# Patient Record
Sex: Female | Born: 1985 | Race: Black or African American | Hispanic: No | Marital: Single | Smoking: Current every day smoker
Health system: Southern US, Community
[De-identification: ages and names within clinical notes are randomized; demographics above are authoritative.]

## PROBLEM LIST (undated history)

## (undated) DIAGNOSIS — Z789 Other specified health status: Secondary | ICD-10-CM

## (undated) DIAGNOSIS — R87629 Unspecified abnormal cytological findings in specimens from vagina: Secondary | ICD-10-CM

## (undated) DIAGNOSIS — F419 Anxiety disorder, unspecified: Secondary | ICD-10-CM

## (undated) HISTORY — PX: NO PAST SURGERIES: SHX2092

---
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
## 2003-01-04 ENCOUNTER — Other Ambulatory Visit: Admission: RE | Admit: 2003-01-04 | Discharge: 2003-01-04 | Payer: Self-pay | Admitting: Obstetrics and Gynecology

## 2003-03-26 ENCOUNTER — Encounter: Admission: RE | Admit: 2003-03-26 | Discharge: 2003-03-26 | Payer: Self-pay | Admitting: Family Medicine

## 2003-03-26 ENCOUNTER — Encounter: Payer: Self-pay | Admitting: Family Medicine

## 2003-04-10 ENCOUNTER — Other Ambulatory Visit: Admission: RE | Admit: 2003-04-10 | Discharge: 2003-04-10 | Payer: Self-pay | Admitting: Obstetrics and Gynecology

## 2003-12-03 ENCOUNTER — Other Ambulatory Visit: Admission: RE | Admit: 2003-12-03 | Discharge: 2003-12-03 | Payer: Self-pay | Admitting: Obstetrics and Gynecology

## 2005-01-15 ENCOUNTER — Other Ambulatory Visit: Admission: RE | Admit: 2005-01-15 | Discharge: 2005-01-15 | Payer: Self-pay | Admitting: Obstetrics and Gynecology

## 2006-04-13 ENCOUNTER — Other Ambulatory Visit: Admission: RE | Admit: 2006-04-13 | Discharge: 2006-04-13 | Payer: Self-pay | Admitting: Family Medicine

## 2006-04-26 ENCOUNTER — Other Ambulatory Visit: Admission: RE | Admit: 2006-04-26 | Discharge: 2006-04-26 | Payer: Self-pay | Admitting: Family Medicine

## 2007-08-24 ENCOUNTER — Emergency Department (HOSPITAL_COMMUNITY): Admission: EM | Admit: 2007-08-24 | Discharge: 2007-08-24 | Payer: Self-pay | Admitting: Emergency Medicine

## 2008-01-08 ENCOUNTER — Emergency Department (HOSPITAL_COMMUNITY): Admission: EM | Admit: 2008-01-08 | Discharge: 2008-01-08 | Payer: Self-pay | Admitting: Family Medicine

## 2008-06-07 ENCOUNTER — Inpatient Hospital Stay (HOSPITAL_COMMUNITY): Admission: AD | Admit: 2008-06-07 | Discharge: 2008-06-07 | Payer: Self-pay | Admitting: Obstetrics & Gynecology

## 2008-06-12 ENCOUNTER — Ambulatory Visit: Payer: Self-pay | Admitting: Gynecology

## 2008-06-12 ENCOUNTER — Other Ambulatory Visit: Admission: RE | Admit: 2008-06-12 | Discharge: 2008-06-12 | Payer: Self-pay | Admitting: Gynecology

## 2008-06-12 ENCOUNTER — Encounter: Payer: Self-pay | Admitting: Gynecology

## 2009-01-10 ENCOUNTER — Emergency Department (HOSPITAL_COMMUNITY): Admission: EM | Admit: 2009-01-10 | Discharge: 2009-01-10 | Payer: Self-pay | Admitting: Emergency Medicine

## 2009-07-02 ENCOUNTER — Emergency Department (HOSPITAL_BASED_OUTPATIENT_CLINIC_OR_DEPARTMENT_OTHER): Admission: EM | Admit: 2009-07-02 | Discharge: 2009-07-02 | Payer: Self-pay | Admitting: Emergency Medicine

## 2009-07-14 ENCOUNTER — Emergency Department (HOSPITAL_BASED_OUTPATIENT_CLINIC_OR_DEPARTMENT_OTHER): Admission: EM | Admit: 2009-07-14 | Discharge: 2009-07-14 | Payer: Self-pay | Admitting: Emergency Medicine

## 2010-09-14 LAB — URINALYSIS, ROUTINE W REFLEX MICROSCOPIC
Specific Gravity, Urine: 1.013 (ref 1.005–1.030)
pH: 8 (ref 5.0–8.0)

## 2010-09-14 LAB — WET PREP, GENITAL: WBC, Wet Prep HPF POC: NONE SEEN

## 2010-09-14 LAB — GC/CHLAMYDIA PROBE AMP, GENITAL
Chlamydia, DNA Probe: NEGATIVE
GC Probe Amp, Genital: NEGATIVE

## 2010-09-14 LAB — PREGNANCY, URINE: Preg Test, Ur: NEGATIVE

## 2010-09-15 LAB — URINALYSIS, ROUTINE W REFLEX MICROSCOPIC
Bilirubin Urine: NEGATIVE
Leukocytes, UA: NEGATIVE
Nitrite: NEGATIVE
Specific Gravity, Urine: 1.026 (ref 1.005–1.030)
pH: 6.5 (ref 5.0–8.0)

## 2010-09-15 LAB — COMPREHENSIVE METABOLIC PANEL
ALT: 18 U/L (ref 0–35)
AST: 25 U/L (ref 0–37)
Albumin: 4.9 g/dL (ref 3.5–5.2)
Calcium: 9.5 mg/dL (ref 8.4–10.5)
GFR calc Af Amer: >60 mL/min (ref >60)
Glucose, Bld: 95 mg/dL (ref 70–99)
Sodium: 141 mEq/L (ref 135–145)
Total Protein: 8.7 g/dL — ABNORMAL HIGH (ref 6.0–8.3)

## 2010-09-15 LAB — DIFFERENTIAL
Eosinophils Absolute: 0 10*3/uL (ref 0.0–0.7)
Lymphs Abs: 2 10*3/uL (ref 0.7–4.0)
Monocytes Absolute: 0.4 10*3/uL (ref 0.1–1.0)
Monocytes Relative: 10 % (ref 3–12)
Neutrophils Relative %: 44 % (ref 43–77)

## 2010-09-15 LAB — GC/CHLAMYDIA PROBE AMP, GENITAL: Chlamydia, DNA Probe: NEGATIVE

## 2010-09-15 LAB — URINE MICROSCOPIC-ADD ON

## 2010-09-15 LAB — WET PREP, GENITAL

## 2010-09-15 LAB — CBC
MCHC: 34 g/dL (ref 30.0–36.0)
Platelets: 243 10*3/uL (ref 150–400)
RDW: 12 % (ref 11.5–15.5)

## 2010-09-15 LAB — PREGNANCY, URINE: Preg Test, Ur: NEGATIVE

## 2010-10-05 LAB — WET PREP, GENITAL: Yeast Wet Prep HPF POC: NONE SEEN

## 2010-10-05 LAB — GC/CHLAMYDIA PROBE AMP, GENITAL: Chlamydia, DNA Probe: POSITIVE — AB

## 2011-02-06 ENCOUNTER — Encounter (HOSPITAL_COMMUNITY): Payer: Self-pay | Admitting: *Deleted

## 2011-02-06 ENCOUNTER — Inpatient Hospital Stay (HOSPITAL_COMMUNITY)
Admission: AD | Admit: 2011-02-06 | Discharge: 2011-02-06 | Disposition: A | Discharge status: Home / Self Care | Payer: Self-pay | Source: Ambulatory Visit | Attending: Family Medicine | Admitting: Family Medicine

## 2011-02-06 DIAGNOSIS — N926 Irregular menstruation, unspecified: Secondary | ICD-10-CM | POA: Insufficient documentation

## 2011-02-06 HISTORY — DX: Other specified health status: Z78.9

## 2011-02-06 LAB — URINALYSIS, ROUTINE W REFLEX MICROSCOPIC
Bilirubin Urine: NEGATIVE
Glucose, UA: NEGATIVE mg/dL
Ketones, ur: 15 mg/dL — AB
pH: 6 (ref 5.0–8.0)

## 2011-02-06 LAB — WET PREP, GENITAL: Yeast Wet Prep HPF POC: NONE SEEN

## 2011-02-06 LAB — CBC
HCT: 41.2 % (ref 36.0–46.0)
MCV: 87.5 fL (ref 78.0–100.0)
Platelets: 251 10*3/uL (ref 150–400)
RBC: 4.71 MIL/uL (ref 3.87–5.11)
WBC: 4.3 10*3/uL (ref 4.0–10.5)

## 2011-02-06 NOTE — ED Notes (Signed)
Pt now states had postive blood pregnancy test July 18th at [facility] in [place]

## 2011-02-06 NOTE — Progress Notes (Signed)
Pt states her LMP was 4-10. Has had periods of spotting on and off with the last one on 7-22. Pt state she has been seen in [facility] in [place] and had a POS serum pregnancy test at [facility] in June. Pt is not having any problems at this time but wants to make sure everything. Has had no ulttrasound for confirmation of  What she believes is a  [redacted] week gestation.

## 2011-02-06 NOTE — ED Notes (Signed)
T. Burleson NP in to see pt 

## 2011-02-06 NOTE — Progress Notes (Signed)
Lilyan Punt NP in to see pt. Spec exam done and wet prep and GC/Chlam obtained. Pt tol well.

## 2011-02-06 NOTE — ED Notes (Signed)
LMP 10/07/10. Had spotting from 5/18 to 5/20 and seen at [facility] ED in [place].  Spotted 6-18 thru 6-20 and had heavier bleeding 7/24-7/26. Has had nausea off and on and tender breasts. Trying to get pregnant. Used Depo and Otho Tricycline 2 yrs ago but nothing since. Usually has regular periods.

## 2011-02-06 NOTE — Progress Notes (Signed)
Lilyan Punt NP gave pt d/c instructions. To f/u with primary care physician as needed

## 2011-02-06 NOTE — ED Provider Notes (Addendum)
History     Chief Complaint  Patient presents with  . Possible Pregnancy   HPI Shelly York 25 y.o. Is in [place] on business.  Had positive pregnancy test in [place] in mid July.  Has had nausea and vomiting.  LMP was in April.  Client is concerned as she has not had an ultrasound and "does not know what is going on with her body."  OB History    Grav Para Term Preterm Abortions TAB SAB Ect Mult Living   1 0 0 0 1 0 0 0 0 0       Past Medical History  Diagnosis Date  . No pertinent past medical history     Past Surgical History  Procedure Date  . No past surgeries     Family History  Problem Relation Age of Onset  . Hypertension Maternal Grandmother   . Diabetes Paternal Grandmother     History  Substance Use Topics  . Smoking status: Former Games developer  . Smokeless tobacco: Not on file  . Alcohol Use: No     States has stopped for a month now    Allergies: No Known Allergies  Prescriptions prior to admission  Medication Sig Dispense Refill  . Biotin 1000 MCG tablet Take 1,000 mcg by mouth daily.        . calcium gluconate 650 MG tablet Take 650 mg by mouth daily.        . hydrocortisone 0.5 % cream Apply 1 application topically 2 (two) times a week.        . Multiple Vitamins-Minerals (MULTIVITAMIN WITH MINERALS) tablet Take 1 tablet by mouth daily.          ROS Physical Exam   Blood pressure 131/88, pulse 96, temperature 98.8 F (37.1 C), temperature source Oral, resp. rate 18, height 5' 7.5" (1.715 m), weight 109 lb 3.2 oz (49.533 kg), last menstrual period 10/07/2010, SpO2 99.00%.  Physical Exam  Nursing note and vitals reviewed. Constitutional: She is oriented to person, place, and time. She appears well-developed and well-nourished.  HENT:  Head: Normocephalic.  Eyes: EOM are normal.  Neck: Neck supple.  GI: Soft. There is no tenderness. There is no rebound and no guarding.  Genitourinary:       Speculum exam: Vagina - Small amount of creamy  discharge, no odor Cervix - No contact bleeding Bimanual exam: Cervix closed Uterus non tender, normal size Adnexa non tender, no masses bilaterally GC/Chlam, wet prep done Chaperone present for exam.    Musculoskeletal: Normal range of motion.  Neurological: She is alert and oriented to person, place, and time.  Skin: Skin is warm and dry.  Psychiatric: She has a normal mood and affect.    MAU Course  Procedures Results for orders placed during the hospital encounter of 02/06/11 (from the past 24 hour(s))  URINALYSIS, ROUTINE W REFLEX MICROSCOPIC     Status: Abnormal   Collection Time   02/06/11  7:05 PM      Component Value Range   Color, Urine YELLOW  YELLOW    Appearance CLEAR  CLEAR    Specific Gravity, Urine >1.030 (*) 1.005 - 1.030    pH 6.0  5.0 - 8.0    Glucose, UA NEGATIVE  NEGATIVE (mg/dL)   Hgb urine dipstick NEGATIVE  NEGATIVE    Bilirubin Urine NEGATIVE  NEGATIVE    Ketones, ur 15 (*) NEGATIVE (mg/dL)   Protein, ur NEGATIVE  NEGATIVE (mg/dL)   Urobilinogen, UA 0.2  0.0 -  1.0 (mg/dL)   Nitrite NEGATIVE  NEGATIVE    Leukocytes, UA NEGATIVE  NEGATIVE   POCT PREGNANCY, URINE     Status: Normal   Collection Time   02/06/11  7:12 PM      Component Value Range   Preg Test, Ur NEGATIVE    ABO/RH     Status: Normal   Collection Time   02/06/11  8:35 PM      Component Value Range   ABO/RH(D) O POS    HCG, QUANTITATIVE, PREGNANCY     Status: Normal   Collection Time   02/06/11  8:36 PM      Component Value Range   hCG, Beta Chain, Quant, S 1  <5 (mIU/mL)  CBC     Status: Normal   Collection Time   02/06/11  8:36 PM      Component Value Range   WBC 4.3  4.0 - 10.5 (K/uL)   RBC 4.71  3.87 - 5.11 (MIL/uL)   Hemoglobin 14.6  12.0 - 15.0 (g/dL)   HCT 16.1  09.6 - 04.5 (%)   MCV 87.5  78.0 - 100.0 (fL)   MCH 31.0  26.0 - 34.0 (pg)   MCHC 35.4  30.0 - 36.0 (g/dL)   RDW 40.9  81.1 - 91.4 (%)   Platelets 251  150 - 400 (K/uL)  WET PREP, GENITAL     Status: Abnormal    Collection Time   02/06/11  9:14 PM      Component Value Range   Yeast, Wet Prep NONE SEEN  NONE SEEN    Trich, Wet Prep NONE SEEN  NONE SEEN    Clue Cells, Wet Prep MANY (*) NONE SEEN    WBC, Wet Prep HPF POC FEW (*) NONE SEEN    MDM Denies any problems with vaginal discharge.  No treatment indicated tonight.  Assessment and Plan  Not pregnant Irregular menses.  Plan: Follow up with your doctor for evaluation of irregular menses.   Natina Wiginton 02/06/2011, 8:25 PM   Nolene Bernheim, NP 02/06/11 2153

## 2011-02-07 LAB — GC/CHLAMYDIA PROBE AMP, GENITAL: Chlamydia, DNA Probe: NEGATIVE

## 2011-02-17 ENCOUNTER — Emergency Department (HOSPITAL_COMMUNITY)
Admission: EM | Admit: 2011-02-17 | Discharge: 2011-02-17 | Discharge status: Against Medical Advice | Payer: Self-pay | Attending: Emergency Medicine | Admitting: Emergency Medicine

## 2011-02-17 DIAGNOSIS — R51 Headache: Secondary | ICD-10-CM | POA: Insufficient documentation

## 2011-02-17 DIAGNOSIS — M545 Low back pain, unspecified: Secondary | ICD-10-CM | POA: Insufficient documentation

## 2011-02-17 DIAGNOSIS — R509 Fever, unspecified: Secondary | ICD-10-CM | POA: Insufficient documentation

## 2011-02-17 DIAGNOSIS — R5383 Other fatigue: Secondary | ICD-10-CM | POA: Insufficient documentation

## 2011-02-17 DIAGNOSIS — R5381 Other malaise: Secondary | ICD-10-CM | POA: Insufficient documentation

## 2011-03-20 LAB — POCT URINALYSIS DIP (DEVICE)
Operator id: 247071
Protein, ur: 30 — AB
Specific Gravity, Urine: >=1.03
pH: 5

## 2011-03-20 LAB — WET PREP, GENITAL
Clue Cells Wet Prep HPF POC: NONE SEEN
Trich, Wet Prep: NONE SEEN
Yeast Wet Prep HPF POC: NONE SEEN

## 2011-03-20 LAB — GC/CHLAMYDIA PROBE AMP, GENITAL: GC Probe Amp, Genital: NEGATIVE

## 2011-03-26 LAB — POCT URINALYSIS DIP (DEVICE)
Bilirubin Urine: NEGATIVE
Hgb urine dipstick: NEGATIVE
Ketones, ur: NEGATIVE
Protein, ur: NEGATIVE
Specific Gravity, Urine: 1.025
pH: 5.5

## 2011-03-26 LAB — WET PREP, GENITAL

## 2011-03-26 LAB — GC/CHLAMYDIA PROBE AMP, GENITAL
Chlamydia, DNA Probe: NEGATIVE
GC Probe Amp, Genital: NEGATIVE

## 2011-03-26 LAB — POCT PREGNANCY, URINE: Operator id: 235561

## 2011-04-02 LAB — WET PREP, GENITAL: Yeast Wet Prep HPF POC: NONE SEEN

## 2011-04-02 LAB — HCG, SERUM, QUALITATIVE: Preg, Serum: NEGATIVE

## 2011-04-30 NOTE — ED Provider Notes (Signed)
Chart reviewed and agree with management and plan.  

## 2011-09-14 ENCOUNTER — Encounter (HOSPITAL_COMMUNITY): Payer: Self-pay

## 2011-09-14 ENCOUNTER — Inpatient Hospital Stay (HOSPITAL_COMMUNITY)
Admission: AD | Admit: 2011-09-14 | Discharge: 2011-09-14 | Disposition: A | Discharge status: Home / Self Care | Payer: Medicaid - Out of State | Source: Ambulatory Visit | Attending: Obstetrics & Gynecology | Admitting: Obstetrics & Gynecology

## 2011-09-14 DIAGNOSIS — H6690 Otitis media, unspecified, unspecified ear: Secondary | ICD-10-CM | POA: Insufficient documentation

## 2011-09-14 DIAGNOSIS — H9209 Otalgia, unspecified ear: Secondary | ICD-10-CM | POA: Insufficient documentation

## 2011-09-14 LAB — HCG, SERUM, QUALITATIVE: Preg, Serum: NEGATIVE

## 2011-09-14 LAB — POCT PREGNANCY, URINE: Preg Test, Ur: NEGATIVE

## 2011-09-14 MED ORDER — IBUPROFEN 800 MG PO TABS
800.0000 mg | ORAL_TABLET | Freq: Once | ORAL | Status: AC
  Administered 2011-09-14: 800 mg via ORAL
  Filled 2011-09-14: qty 1

## 2011-09-14 MED ORDER — AMOXICILLIN-POT CLAVULANATE 875-125 MG PO TABS: 1.0000 | ORAL_TABLET | Freq: Two times a day (BID) | ORAL | Status: AC

## 2011-09-14 MED ORDER — IBUPROFEN 800 MG PO TABS: 800.0000 mg | ORAL_TABLET | Freq: Three times a day (TID) | ORAL | Status: AC | PRN

## 2011-09-14 NOTE — MAU Note (Signed)
Patient states she woke up this morning with severe bilateral ear pain. Has not had a regular period since October. Had a few days of bleeding in January. Had a negative pregnancy test at [facility] [place] about one month ago but has been feeling movement in her belly and belly bulging. No bleeding at this time. Vomited after eating soup but has been able to keep everything else down.

## 2011-09-14 NOTE — MAU Provider Note (Signed)
History     CSN: [id number]  Arrival date and time: 09/14/11 1549   First Provider Initiated Contact with Patient 09/14/11 1701      Chief Complaint  Patient presents with  . Otalgia  . Possible Pregnancy   HPI 26 y.o. G0P0000 with bilateral ear pain since last night, some congestion and rhinorrhea. Vomited one time today. Also c/o irregular cycles since October, length of cycle went from 7 days to 3 days, had period at end of January, spotting only at beginning of March. Pt states that after she eats her stomach becomes bloated and she feels movement in her belly, believes she is pregnant but has had multiple negative pregnancy tests. States she wants a blood pregnancy test.    Past Medical History  Diagnosis Date  . No pertinent past medical history     Past Surgical History  Procedure Date  . No past surgeries     Family History  Problem Relation Age of Onset  . Hypertension Maternal Grandmother   . Diabetes Paternal Grandmother   . Anesthesia problems Neg Hx     History  Substance Use Topics  . Smoking status: Former Games developer  . Smokeless tobacco: Not on file  . Alcohol Use: No     States has stopped for a month now    Allergies: No Known Allergies  No prescriptions prior to admission    Review of Systems  Constitutional: Negative.   HENT: Positive for ear pain and congestion.   Respiratory: Negative.   Cardiovascular: Negative.   Gastrointestinal: Positive for nausea and vomiting. Negative for abdominal pain, diarrhea and constipation.  Genitourinary: Negative for dysuria, urgency, frequency, hematuria and flank pain.       Negative for vaginal bleeding, vaginal discharge  Musculoskeletal: Negative.   Neurological: Negative.   Psychiatric/Behavioral: Negative.    Physical Exam   Blood pressure 125/78, pulse 89, temperature 99.4 F (37.4 C), temperature source Oral, resp. rate 16, height 5' 7.5" (1.715 m), weight 52.073 kg (114 lb 12.8 oz), last  menstrual period 07/29/2011, SpO2 99.00%.  Physical Exam  Nursing note and vitals reviewed. Constitutional: She is oriented to person, place, and time. She appears well-developed and well-nourished. No distress.  HENT:  Right Ear: External ear normal. Tympanic membrane is erythematous and bulging. A middle ear effusion is present.  Left Ear: External ear normal. Tympanic membrane is erythematous and bulging. A middle ear effusion is present.  Neck: Normal range of motion.  Cardiovascular: Normal rate.   Respiratory: Effort normal.  GI: Soft. She exhibits no distension and no mass. There is no tenderness. There is no rebound and no guarding.  Musculoskeletal: Normal range of motion.  Neurological: She is oriented to person, place, and time.  Skin: Skin is warm and dry.  Psychiatric: She has a normal mood and affect.    MAU Course  Procedures  Results for orders placed during the hospital encounter of 09/14/11 (from the past 48 hour(s))  POCT PREGNANCY, URINE     Status: Normal   Collection Time   09/14/11  4:50 PM      Component Value Range Comment   Preg Test, Ur NEGATIVE  NEGATIVE    HCG, SERUM, QUALITATIVE     Status: Normal   Collection Time   09/14/11  5:25 PM      Component Value Range Comment   Preg, Serum NEGATIVE  NEGATIVE       Assessment and Plan  26 y.o. G0P0000 with bilateral acute  otitis media Rx Amoxicillin F/U with PCP as needed  Jamell Opfer 09/16/2011, 1:02 AM

## 2011-12-22 ENCOUNTER — Encounter (HOSPITAL_COMMUNITY): Payer: Self-pay | Admitting: *Deleted

## 2011-12-22 ENCOUNTER — Inpatient Hospital Stay (HOSPITAL_COMMUNITY)
Admission: AD | Admit: 2011-12-22 | Discharge: 2011-12-22 | Disposition: A | Discharge status: Home / Self Care | Payer: Medicaid - Out of State | Source: Ambulatory Visit | Attending: Obstetrics & Gynecology | Admitting: Obstetrics & Gynecology

## 2011-12-22 DIAGNOSIS — R05 Cough: Secondary | ICD-10-CM | POA: Insufficient documentation

## 2011-12-22 DIAGNOSIS — R059 Cough, unspecified: Secondary | ICD-10-CM | POA: Insufficient documentation

## 2011-12-22 DIAGNOSIS — N911 Secondary amenorrhea: Secondary | ICD-10-CM

## 2011-12-22 DIAGNOSIS — N926 Irregular menstruation, unspecified: Secondary | ICD-10-CM | POA: Insufficient documentation

## 2011-12-22 DIAGNOSIS — R109 Unspecified abdominal pain: Secondary | ICD-10-CM | POA: Insufficient documentation

## 2011-12-22 DIAGNOSIS — N912 Amenorrhea, unspecified: Secondary | ICD-10-CM

## 2011-12-22 DIAGNOSIS — J069 Acute upper respiratory infection, unspecified: Secondary | ICD-10-CM

## 2011-12-22 LAB — URINALYSIS, ROUTINE W REFLEX MICROSCOPIC
Bilirubin Urine: NEGATIVE
Leukocytes, UA: NEGATIVE
Nitrite: NEGATIVE
Specific Gravity, Urine: 1.015 (ref 1.005–1.030)
Urobilinogen, UA: 0.2 mg/dL (ref 0.0–1.0)
pH: 7.5 (ref 5.0–8.0)

## 2011-12-22 LAB — WET PREP, GENITAL
Trich, Wet Prep: NONE SEEN
Yeast Wet Prep HPF POC: NONE SEEN

## 2011-12-22 MED ORDER — DM-GUAIFENESIN ER 30-600 MG PO TB12: 1.0000 | ORAL_TABLET | Freq: Two times a day (BID) | ORAL | Status: AC

## 2011-12-22 MED ORDER — METRONIDAZOLE 500 MG PO TABS: 500.0000 mg | ORAL_TABLET | Freq: Two times a day (BID) | ORAL | Status: AC

## 2011-12-22 NOTE — Progress Notes (Signed)
Pt states her chest feels dry when she coughs

## 2011-12-22 NOTE — MAU Note (Signed)
Flu like symptoms, and irregular menses. Pt states she has  Been taking over the counter cold medication. Pt states she is feeling movement in her abdomen and also feels like she has stuff in her breast

## 2011-12-22 NOTE — Progress Notes (Addendum)
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

## 2011-12-23 LAB — GC/CHLAMYDIA PROBE AMP, GENITAL: GC Probe Amp, Genital: NEGATIVE

## 2012-06-17 ENCOUNTER — Encounter (HOSPITAL_BASED_OUTPATIENT_CLINIC_OR_DEPARTMENT_OTHER): Payer: Self-pay | Admitting: *Deleted

## 2012-06-17 ENCOUNTER — Emergency Department (HOSPITAL_BASED_OUTPATIENT_CLINIC_OR_DEPARTMENT_OTHER): Payer: Self-pay

## 2012-06-17 ENCOUNTER — Emergency Department (HOSPITAL_BASED_OUTPATIENT_CLINIC_OR_DEPARTMENT_OTHER)
Admission: EM | Admit: 2012-06-17 | Discharge: 2012-06-17 | Disposition: A | Discharge status: Home / Self Care | Payer: Self-pay | Attending: Emergency Medicine | Admitting: Emergency Medicine

## 2012-06-17 DIAGNOSIS — J029 Acute pharyngitis, unspecified: Secondary | ICD-10-CM | POA: Insufficient documentation

## 2012-06-17 DIAGNOSIS — R509 Fever, unspecified: Secondary | ICD-10-CM | POA: Insufficient documentation

## 2012-06-17 DIAGNOSIS — J111 Influenza due to unidentified influenza virus with other respiratory manifestations: Secondary | ICD-10-CM

## 2012-06-17 DIAGNOSIS — Z3202 Encounter for pregnancy test, result negative: Secondary | ICD-10-CM | POA: Insufficient documentation

## 2012-06-17 DIAGNOSIS — IMO0001 Reserved for inherently not codable concepts without codable children: Secondary | ICD-10-CM | POA: Insufficient documentation

## 2012-06-17 DIAGNOSIS — Z87891 Personal history of nicotine dependence: Secondary | ICD-10-CM | POA: Insufficient documentation

## 2012-06-17 DIAGNOSIS — B9789 Other viral agents as the cause of diseases classified elsewhere: Secondary | ICD-10-CM | POA: Insufficient documentation

## 2012-06-17 DIAGNOSIS — B349 Viral infection, unspecified: Secondary | ICD-10-CM

## 2012-06-17 DIAGNOSIS — J069 Acute upper respiratory infection, unspecified: Secondary | ICD-10-CM | POA: Insufficient documentation

## 2012-06-17 DIAGNOSIS — R6889 Other general symptoms and signs: Secondary | ICD-10-CM | POA: Insufficient documentation

## 2012-06-17 LAB — URINALYSIS, ROUTINE W REFLEX MICROSCOPIC
Bilirubin Urine: NEGATIVE
Glucose, UA: NEGATIVE mg/dL
Hgb urine dipstick: NEGATIVE
Ketones, ur: >80 mg/dL — AB
Protein, ur: NEGATIVE mg/dL
Urobilinogen, UA: 0.2 mg/dL (ref 0.0–1.0)

## 2012-06-17 LAB — PREGNANCY, URINE: Preg Test, Ur: NEGATIVE

## 2012-06-17 MED ORDER — BENZONATATE 100 MG PO CAPS: 100.0000 mg | ORAL_CAPSULE | Freq: Three times a day (TID) | ORAL | Status: DC

## 2012-06-17 MED ORDER — GUAIFENESIN ER 600 MG PO TB12: 1200.0000 mg | ORAL_TABLET | Freq: Two times a day (BID) | ORAL | Status: DC

## 2012-06-17 MED ORDER — IBUPROFEN 400 MG PO TABS: 400.0000 mg | ORAL_TABLET | Freq: Four times a day (QID) | ORAL | Status: DC | PRN

## 2012-06-17 MED ORDER — IBUPROFEN 800 MG PO TABS
800.0000 mg | ORAL_TABLET | Freq: Once | ORAL | Status: AC
  Administered 2012-06-17: 800 mg via ORAL
  Filled 2012-06-17: qty 1

## 2012-06-17 MED ORDER — ACETAMINOPHEN 325 MG PO TABS
650.0000 mg | ORAL_TABLET | Freq: Once | ORAL | Status: AC
  Administered 2012-06-17: 650 mg via ORAL
  Filled 2012-06-17: qty 2

## 2012-06-17 NOTE — ED Notes (Signed)
Pt c/o fever, chills, lower back pain, cough since this am.

## 2012-06-17 NOTE — ED Notes (Signed)
Chills, cough, and headache.

## 2012-06-17 NOTE — ED Provider Notes (Signed)
History     CSN: [id number]  Arrival date & time 06/17/12  1756   First MD Initiated Contact with Patient 06/17/12 1832      Chief Complaint  Patient presents with  . URI    (Consider location/radiation/quality/duration/timing/severity/associated sxs/prior treatment) HPIEnasha L York is a 26 y.o. female presenting with a days worth of cough, runny nose, mild sore throat, chills, fever and myalgias. Patient's cough has been mildly productive of yellow sputum. Patient is a former smoker. Denies any lightheadedness, chest pain, shortness of breath. Patient's cough and other symptoms of a moderate to severe, constant, unchanged since this morning she has taken some over-the-counter medications with minimal relief. She's had intermittent headaches without a stiff neck, changes in vision.    Past Medical History  Diagnosis Date  . No pertinent past medical history     Past Surgical History  Procedure Date  . No past surgeries     Family History  Problem Relation Age of Onset  . Hypertension Maternal Grandmother   . Diabetes Paternal Grandmother   . Anesthesia problems Neg Hx     History  Substance Use Topics  . Smoking status: Former Games developer  . Smokeless tobacco: Not on file  . Alcohol Use: No     Comment: States has stopped for a month now    OB History    Grav Para Term Preterm Abortions TAB SAB Ect Mult Living   0 0 0 0 0 0 0 0 0 0       Review of Systems At least 10pt or greater review of systems completed and are negative except where specified in the HPI.  Allergies  Review of patient's allergies indicates no known allergies.  Home Medications   Current Outpatient Rx  Name  Route  Sig  Dispense  Refill  . ACETAMINOPHEN 500 MG PO TABS   Oral   Take 1,000 mg by mouth every 6 (six) hours as needed. fever         . DEXTROMETHORPHAN-GUAIFENESIN 10-100 MG/5ML PO SYRP   Oral   Take 10 mLs by mouth every 12 (twelve) hours.         Marland Kitchen  DM-PHENYLEPHRINE-ACETAMINOPHEN 10-5-325 MG PO CAPS   Oral   Take 2 capsules by mouth at bedtime.         . MULTI-VITAMIN/MINERALS PO TABS   Oral   Take 1 tablet by mouth daily.             BP 113/61  Pulse 101  Temp 101.5 F (38.6 C) (Oral)  Resp 22  SpO2 100%  LMP 05/18/2012  Physical Exam  Nursing notes reviewed.  Electronic medical record reviewed. VITAL SIGNS:   Filed Vitals:   06/17/12 1828  BP: 113/61  Pulse: 101  Temp: 101.5 F (38.6 C)  TempSrc: Oral  Resp: 22  SpO2: 100%   CONSTITUTIONAL: Awake, oriented, appears non-toxic HENT: Atraumatic, normocephalic, oral mucosa pink and moist, airway patent. Nares patent with minimal clear nasal drainage External ears normal. EYES: Conjunctiva clear, EOMI, PERRLA NECK: Trachea midline, non-tender, supple CARDIOVASCULAR: Normal heart rate, Normal rhythm, No murmurs, rubs, gallops PULMONARY/CHEST: Clear to auscultation, no rhonchi, wheezes. Few scattered rales heard in the right base but cleared on coughing. Symmetrical breath sounds. Non-tender. ABDOMINAL: Non-distended, soft, non-tender - no rebound or guarding.  BS normal. NEUROLOGIC: Non-focal, moving all four extremities, no gross sensory or motor deficits. EXTREMITIES: No clubbing, cyanosis, or edema SKIN: Warm, Dry, No erythema, No rash  ED Course  Procedures (including critical care time)  Labs Reviewed  URINALYSIS, ROUTINE W REFLEX MICROSCOPIC - Abnormal; Notable for the following:    APPearance CLOUDY (*)     Ketones, ur >80 (*)     All other components within normal limits  PREGNANCY, URINE   Dg Chest 2 View  06/17/2012  *RADIOLOGY REPORT*  Clinical Data: Fever, cough and congestion.  CHEST - 2 VIEW  Comparison: None.  Findings: Lungs clear.  No pneumothorax or pleural fluid.  Heart size normal.  No bony abnormality.  IMPRESSION: Normal study.   Original Report Authenticated By: Holley Dexter, M.D.      1. Influenza-like illness   2. Viral  syndrome       MDM  Shelly York is a 26 y.o. female resenting with a viral-like syndrome-possible influenza-like illness. Obtain chest x-ray to 2 abnormal sounds heard in the right base likely atelectasis which cleared with coughing. Chest x-ray was clear.  Do to recent data showing that Tamiflu is an ineffective medicine, we'll not prescribe that. We'll treat the patient conservatively with medications for cough as well as treating her fevers and the pain. I do not think the patient has an infectious type emergency as I do not suspect meningitis, there is no evidence for pneumonia, and I do not think any further testing is required at this time.  I explained the diagnosis and have given explicit precautions to return to the ER including any other new or worsening symptoms. The patient understands and accepts the medical plan as it's been dictated and I have answered their questions. Discharge instructions concerning home care and prescriptions have been given.  The patient is STABLE and is discharged to home in good condition.          Jones Skene, MD 06/18/12 [phone]

## 2012-08-21 ENCOUNTER — Encounter (HOSPITAL_COMMUNITY): Payer: Self-pay | Admitting: *Deleted

## 2012-08-21 ENCOUNTER — Emergency Department (INDEPENDENT_AMBULATORY_CARE_PROVIDER_SITE_OTHER)
Admission: EM | Admit: 2012-08-21 | Discharge: 2012-08-21 | Disposition: A | Discharge status: Home / Self Care | Payer: Self-pay | Source: Home / Self Care | Attending: Family Medicine | Admitting: Family Medicine

## 2012-08-21 DIAGNOSIS — W57XXXA Bitten or stung by nonvenomous insect and other nonvenomous arthropods, initial encounter: Secondary | ICD-10-CM

## 2012-08-21 MED ORDER — FLUTICASONE PROPIONATE 0.05 % EX CREA: TOPICAL_CREAM | Freq: Two times a day (BID) | CUTANEOUS | Status: DC

## 2012-08-21 NOTE — ED Notes (Signed)
Patient complains of hives on neck and arms; also states she was exposed to STD 7 days ago. Denies dysuria, pain, discharge and smell.

## 2012-08-21 NOTE — ED Provider Notes (Signed)
History     CSN: [id number]  Arrival date & time 08/21/12  1140   First MD Initiated Contact with Patient 08/21/12 1140      Chief Complaint  Patient presents with  . Urticaria  . Exposure to STD    (Consider location/radiation/quality/duration/timing/severity/associated sxs/prior treatment) Patient is a 27 y.o. female presenting with urticaria and STD exposure. The history is provided by the patient.  Urticaria This is a new problem. The current episode started yesterday. The problem has not changed since onset. Exposure to STD    Past Medical History  Diagnosis Date  . No pertinent past medical history     Past Surgical History  Procedure Laterality Date  . No past surgeries      Family History  Problem Relation Age of Onset  . Hypertension Maternal Grandmother   . Diabetes Paternal Grandmother   . Anesthesia problems Neg Hx     History  Substance Use Topics  . Smoking status: Former Games developer  . Smokeless tobacco: Not on file  . Alcohol Use: No     Comment: States has stopped for a month now    OB History   Grav Para Term Preterm Abortions TAB SAB Ect Mult Living   0 0 0 0 0 0 0 0 0 0       Review of Systems  Constitutional: Negative.   Skin: Positive for rash.    Allergies  Review of patient's allergies indicates no known allergies.  Home Medications   Current Outpatient Rx  Name  Route  Sig  Dispense  Refill  . acetaminophen (TYLENOL) 500 MG tablet   Oral   Take 1,000 mg by mouth every 6 (six) hours as needed. fever         . benzonatate (TESSALON) 100 MG capsule   Oral   Take 1 capsule (100 mg total) by mouth every 8 (eight) hours.   21 capsule   0   . Dextromethorphan-Guaifenesin (ROBITUSSIN DM) 10-100 MG/5ML liquid   Oral   Take 10 mLs by mouth every 12 (twelve) hours.         Marland Kitchen DM-Phenylephrine-Acetaminophen (ALKA-SELTZER PLS SINUS & COUGH) 10-5-325 MG CAPS   Oral   Take 2 capsules by mouth at bedtime.         Marland Kitchen  guaiFENesin (MUCINEX) 600 MG 12 hr tablet   Oral   Take 2 tablets (1,200 mg total) by mouth 2 (two) times daily.   30 tablet   0   . ibuprofen (ADVIL,MOTRIN) 400 MG tablet   Oral   Take 1 tablet (400 mg total) by mouth every 6 (six) hours as needed for pain.   30 tablet   0   . Multiple Vitamins-Minerals (MULTIVITAMIN WITH MINERALS) tablet   Oral   Take 1 tablet by mouth daily.             There were no vitals taken for this visit.  Physical Exam  Nursing note and vitals reviewed. Constitutional: She appears well-developed and well-nourished.  Skin: Skin is warm and dry. Rash noted.  Pruritic lesions on arm and upper back , central raised papule. nonpustular.    ED Course  Procedures (including critical care time)  Labs Reviewed - No data to display No results found.   No diagnosis found.    MDM  Wants an std check since she was here for rash, is not having sx.        Linna Hoff, MD 08/21/12 [phone]

## 2012-11-30 ENCOUNTER — Emergency Department (HOSPITAL_BASED_OUTPATIENT_CLINIC_OR_DEPARTMENT_OTHER)
Admission: EM | Admit: 2012-11-30 | Discharge: 2012-11-30 | Disposition: A | Discharge status: Home / Self Care | Payer: Managed Care, Other (non HMO) | Attending: Emergency Medicine | Admitting: Emergency Medicine

## 2012-11-30 ENCOUNTER — Encounter (HOSPITAL_BASED_OUTPATIENT_CLINIC_OR_DEPARTMENT_OTHER): Payer: Self-pay

## 2012-11-30 DIAGNOSIS — R42 Dizziness and giddiness: Secondary | ICD-10-CM | POA: Insufficient documentation

## 2012-11-30 DIAGNOSIS — R109 Unspecified abdominal pain: Secondary | ICD-10-CM | POA: Insufficient documentation

## 2012-11-30 DIAGNOSIS — R51 Headache: Secondary | ICD-10-CM | POA: Insufficient documentation

## 2012-11-30 DIAGNOSIS — K219 Gastro-esophageal reflux disease without esophagitis: Secondary | ICD-10-CM | POA: Insufficient documentation

## 2012-11-30 DIAGNOSIS — Z87891 Personal history of nicotine dependence: Secondary | ICD-10-CM | POA: Insufficient documentation

## 2012-11-30 DIAGNOSIS — Z79899 Other long term (current) drug therapy: Secondary | ICD-10-CM | POA: Insufficient documentation

## 2012-11-30 DIAGNOSIS — E876 Hypokalemia: Secondary | ICD-10-CM | POA: Insufficient documentation

## 2012-11-30 DIAGNOSIS — Z3202 Encounter for pregnancy test, result negative: Secondary | ICD-10-CM | POA: Insufficient documentation

## 2012-11-30 LAB — COMPREHENSIVE METABOLIC PANEL
BUN: 11 mg/dL (ref 6–23)
CO2: 25 mEq/L (ref 19–32)
Calcium: 9.7 mg/dL (ref 8.4–10.5)
Chloride: 95 mEq/L — ABNORMAL LOW (ref 96–112)
Creatinine, Ser: 0.8 mg/dL (ref 0.50–1.10)
GFR calc non Af Amer: >90 mL/min (ref >90)
Total Bilirubin: 0.7 mg/dL (ref 0.3–1.2)

## 2012-11-30 LAB — URINALYSIS, ROUTINE W REFLEX MICROSCOPIC
Ketones, ur: >80 mg/dL — AB
Leukocytes, UA: NEGATIVE
Nitrite: NEGATIVE
Protein, ur: NEGATIVE mg/dL
Urobilinogen, UA: 1 mg/dL (ref 0.0–1.0)

## 2012-11-30 LAB — CBC WITH DIFFERENTIAL/PLATELET
Basophils Relative: 0 % (ref 0–1)
Eosinophils Relative: 0 % (ref 0–5)
HCT: 42.6 % (ref 36.0–46.0)
Hemoglobin: 15 g/dL (ref 12.0–15.0)
MCHC: 35.2 g/dL (ref 30.0–36.0)
MCV: 84.9 fL (ref 78.0–100.0)
Monocytes Absolute: 0.4 10*3/uL (ref 0.1–1.0)
Monocytes Relative: 7 % (ref 3–12)
Neutro Abs: 4.1 10*3/uL (ref 1.7–7.7)
RDW: 15 % (ref 11.5–15.5)

## 2012-11-30 LAB — LIPASE, BLOOD: Lipase: 17 U/L (ref 11–59)

## 2012-11-30 MED ORDER — GI COCKTAIL ~~LOC~~
30.0000 mL | Freq: Once | ORAL | Status: AC
  Administered 2012-11-30: 30 mL via ORAL
  Filled 2012-11-30: qty 30

## 2012-11-30 MED ORDER — SODIUM CHLORIDE 0.9 % IV BOLUS (SEPSIS)
1000.0000 mL | Freq: Once | INTRAVENOUS | Status: AC
  Administered 2012-11-30: 1000 mL via INTRAVENOUS

## 2012-11-30 MED ORDER — ONDANSETRON 4 MG PO TBDP: 4.0000 mg | ORAL_TABLET | Freq: Three times a day (TID) | ORAL | Status: DC | PRN

## 2012-11-30 MED ORDER — OMEPRAZOLE 20 MG PO CPDR: 20.0000 mg | DELAYED_RELEASE_CAPSULE | Freq: Every day | ORAL | Status: DC

## 2012-11-30 MED ORDER — ONDANSETRON HCL 4 MG/2ML IJ SOLN
4.0000 mg | Freq: Once | INTRAMUSCULAR | Status: AC
  Administered 2012-11-30: 4 mg via INTRAVENOUS
  Filled 2012-11-30: qty 2

## 2012-11-30 MED ORDER — POTASSIUM CHLORIDE CRYS ER 20 MEQ PO TBCR
40.0000 meq | EXTENDED_RELEASE_TABLET | Freq: Once | ORAL | Status: AC
  Administered 2012-11-30: 40 meq via ORAL
  Filled 2012-11-30: qty 2

## 2012-11-30 NOTE — ED Notes (Signed)
Pt states that she has been nauseous and vomiting since 1600 yesterday afternoon.  Pt reports multiple episodes of vomiting since that time.  Pt states that she has continued to try and drink fluids and eat but continues to vomit.  Pt has epigastric abd pain, denies dysuria, hematuria, diarrhea, fever.

## 2012-11-30 NOTE — ED Provider Notes (Signed)
History     CSN: [id number]  Arrival date & time 11/30/12  1141   First MD Initiated Contact with Patient 11/30/12 1146      Chief Complaint  Patient presents with  . Nausea  . Emesis    (Consider location/radiation/quality/duration/timing/severity/associated sxs/prior treatment) HPI Comments: 27 y.o. Female with PMHx of GERD presents complaining of mild central and upper abdominal pain that started gradually yesterday (5/10), worsening today (8/10). Accompanied by nausea and multiple episodes of vomiting through the night. Pt main concern is that she can't keep anything down. Pain described as cramping, aching, intermittent, and localized. Pain is not severe at the moment (6/10). Admits gradual onset frontal head and light headedness.  Denies fever, dysuria, hematuria, hematochezia, chest pain, shortness of breath.  Pt does not have a PCP and arrived driving herself here.   Patient is a 27 y.o. female presenting with vomiting. The history is provided by the patient.  Emesis Severity:  Severe Duration: serveral hours through the night. Timing:  Intermittent Number of daily episodes:  5-10 Quality:  Stomach contents and malodorous material Feeding tolerance: not able to tolerate liquids, solids. Progression:  Unchanged Chronicity:  New Recent urination:  Normal Relieved by: tried pepto bismol, but she was not able to keep it down. Associated symptoms: abdominal pain and headaches   Associated symptoms: no chills, no cough, no diarrhea, no fever, no sore throat and no URI   Abdominal pain:    Pain location: central and epigastric.   Quality:  Aching and cramping   Severity:  Moderate   Onset quality:  Gradual   Duration: since 4pm yesterday.   Timing:  Intermittent   Progression:  Worsening   Chronicity:  New   Past Medical History  Diagnosis Date  . No pertinent past medical history     Past Surgical History  Procedure Laterality Date  . No past surgeries       Family History  Problem Relation Age of Onset  . Hypertension Maternal Grandmother   . Diabetes Paternal Grandmother   . Anesthesia problems Neg Hx     History  Substance Use Topics  . Smoking status: Former Games developer  . Smokeless tobacco: Not on file  . Alcohol Use: No     Comment: States has stopped for a month now    OB History   Grav Para Term Preterm Abortions TAB SAB Ect Mult Living   0 0 0 0 0 0 0 0 0 0       Review of Systems  Constitutional: Positive for activity change and appetite change. Negative for fever, chills and diaphoresis.  HENT: Negative for sore throat, neck pain and neck stiffness.   Eyes: Negative for visual disturbance.  Respiratory: Negative for apnea, chest tightness and shortness of breath.   Cardiovascular: Negative for chest pain and palpitations.  Gastrointestinal: Positive for vomiting and abdominal pain. Negative for nausea, diarrhea and constipation.  Genitourinary: Negative for dysuria, hematuria, flank pain and pelvic pain.  Musculoskeletal: Negative for gait problem.  Skin: Negative for rash.  Neurological: Positive for light-headedness and headaches. Negative for dizziness, weakness and numbness.       Gradual onset, frontal    Allergies  Review of patient's allergies indicates no known allergies.  Home Medications   Current Outpatient Rx  Name  Route  Sig  Dispense  Refill  . diphenhydrAMINE (BENADRYL) 25 MG tablet   Oral   Take 25 mg by mouth every 6 (six) hours as needed for  itching.         . ferrous gluconate (FERGON) 325 MG tablet   Oral   Take 325 mg by mouth daily with breakfast.         . acetaminophen (TYLENOL) 500 MG tablet   Oral   Take 1,000 mg by mouth every 6 (six) hours as needed. fever         . benzonatate (TESSALON) 100 MG capsule   Oral   Take 1 capsule (100 mg total) by mouth every 8 (eight) hours.   21 capsule   0   . Dextromethorphan-Guaifenesin (ROBITUSSIN DM) 10-100 MG/5ML liquid    Oral   Take 10 mLs by mouth every 12 (twelve) hours.         Marland Kitchen DM-Phenylephrine-Acetaminophen (ALKA-SELTZER PLS SINUS & COUGH) 10-5-325 MG CAPS   Oral   Take 2 capsules by mouth at bedtime.         . fluticasone (CUTIVATE) 0.05 % cream   Topical   Apply topically 2 (two) times daily.   30 g   0   . guaiFENesin (MUCINEX) 600 MG 12 hr tablet   Oral   Take 2 tablets (1,200 mg total) by mouth 2 (two) times daily.   30 tablet   0   . ibuprofen (ADVIL,MOTRIN) 400 MG tablet   Oral   Take 1 tablet (400 mg total) by mouth every 6 (six) hours as needed for pain.   30 tablet   0   . Multiple Vitamins-Minerals (MULTIVITAMIN WITH MINERALS) tablet   Oral   Take 1 tablet by mouth daily.             LMP 11/15/2012  Physical Exam  Nursing note and vitals reviewed. Constitutional: She is oriented to person, place, and time. She appears well-developed and well-nourished. No distress.  Pt appears comfortable.  HENT:  Head: Normocephalic and atraumatic.  Eyes: Conjunctivae and EOM are normal.  Neck: Normal range of motion. Neck supple.  No meningeal signs  Cardiovascular: Normal rate, regular rhythm and normal heart sounds.  Exam reveals no gallop and no friction rub.   No murmur heard. Pulmonary/Chest: Effort normal and breath sounds normal. No respiratory distress. She has no wheezes. She has no rales. She exhibits no tenderness.  Abdominal: Soft. Bowel sounds are normal. She exhibits no distension. There is tenderness. There is no rebound and no guarding.  Mild TTP centrally. No guarding. No pain at McBurney's point.   Musculoskeletal: Normal range of motion. She exhibits no edema and no tenderness.  FROM to upper and lower extremities  Neurological: She is alert and oriented to person, place, and time. No cranial nerve deficit.  Normal gait and balance Normal strength in upper and lower extremities bilaterally including dorsiflexion and plantar flexion, strong and equal grip  strength   Skin: Skin is warm and dry. She is not diaphoretic. No erythema.  Psychiatric: She has a normal mood and affect.    ED Course  Procedures (including critical care time)  Labs Reviewed  CBC WITH DIFFERENTIAL - Abnormal; Notable for the following:    Neutrophils Relative % 79 (*)    All other components within normal limits  COMPREHENSIVE METABOLIC PANEL - Abnormal; Notable for the following:    Sodium 134 (*)    Potassium 3.3 (*)    Chloride 95 (*)    All other components within normal limits  URINALYSIS, ROUTINE W REFLEX MICROSCOPIC - Abnormal; Notable for the following:    Color, Urine  AMBER (*)    APPearance CLOUDY (*)    Bilirubin Urine SMALL (*)    Ketones, ur >80 (*)    All other components within normal limits  LIPASE, BLOOD  PREGNANCY, URINE   No results found.   1. GERD (gastroesophageal reflux disease)   2. Hypokalemia       MDM  Patient is afebrile, nontoxic, nonseptic appearing, in no apparent distress.  On physical exam patient does not appear to have a surgical abdomen and there are no peritoneal signs.  Mild TTP centrally. No indication of appendicitis, bowel obstruction, bowel perforation, cholecystitis, diverticulitis, PID or ectopic pregnancy. Will give zofran, GI cocktail, 1L fluids, basic labs, urinalysis and re-evaluate.  Labs returned mild hypokalemia. Will discuss lab results, recommend follow up, and give K CL. Labs and urinalysis unconcerning. Pt states significant relief after GI cocktail, zofran, and fluids. Will see if pt tolerates PO challenge. If successful will send home on omeprazole, zofran, recommend oral rehydration as tolerated.       Glade Nurse, PA-C 11/30/12 1640

## 2012-12-02 NOTE — ED Provider Notes (Signed)
Medical screening examination/treatment/procedure(s) were performed by non-physician practitioner and as supervising physician I was immediately available for consultation/collaboration.  Gerhard Munch, MD 12/02/12 [id number]

## 2013-07-05 IMAGING — CR DG CHEST 2V
2 series · 2 of 2 positions shown · non-contrast
Comparison: None.

CLINICAL DATA: Fever, cough and congestion.

CHEST - 2 VIEW

[w chest pa]
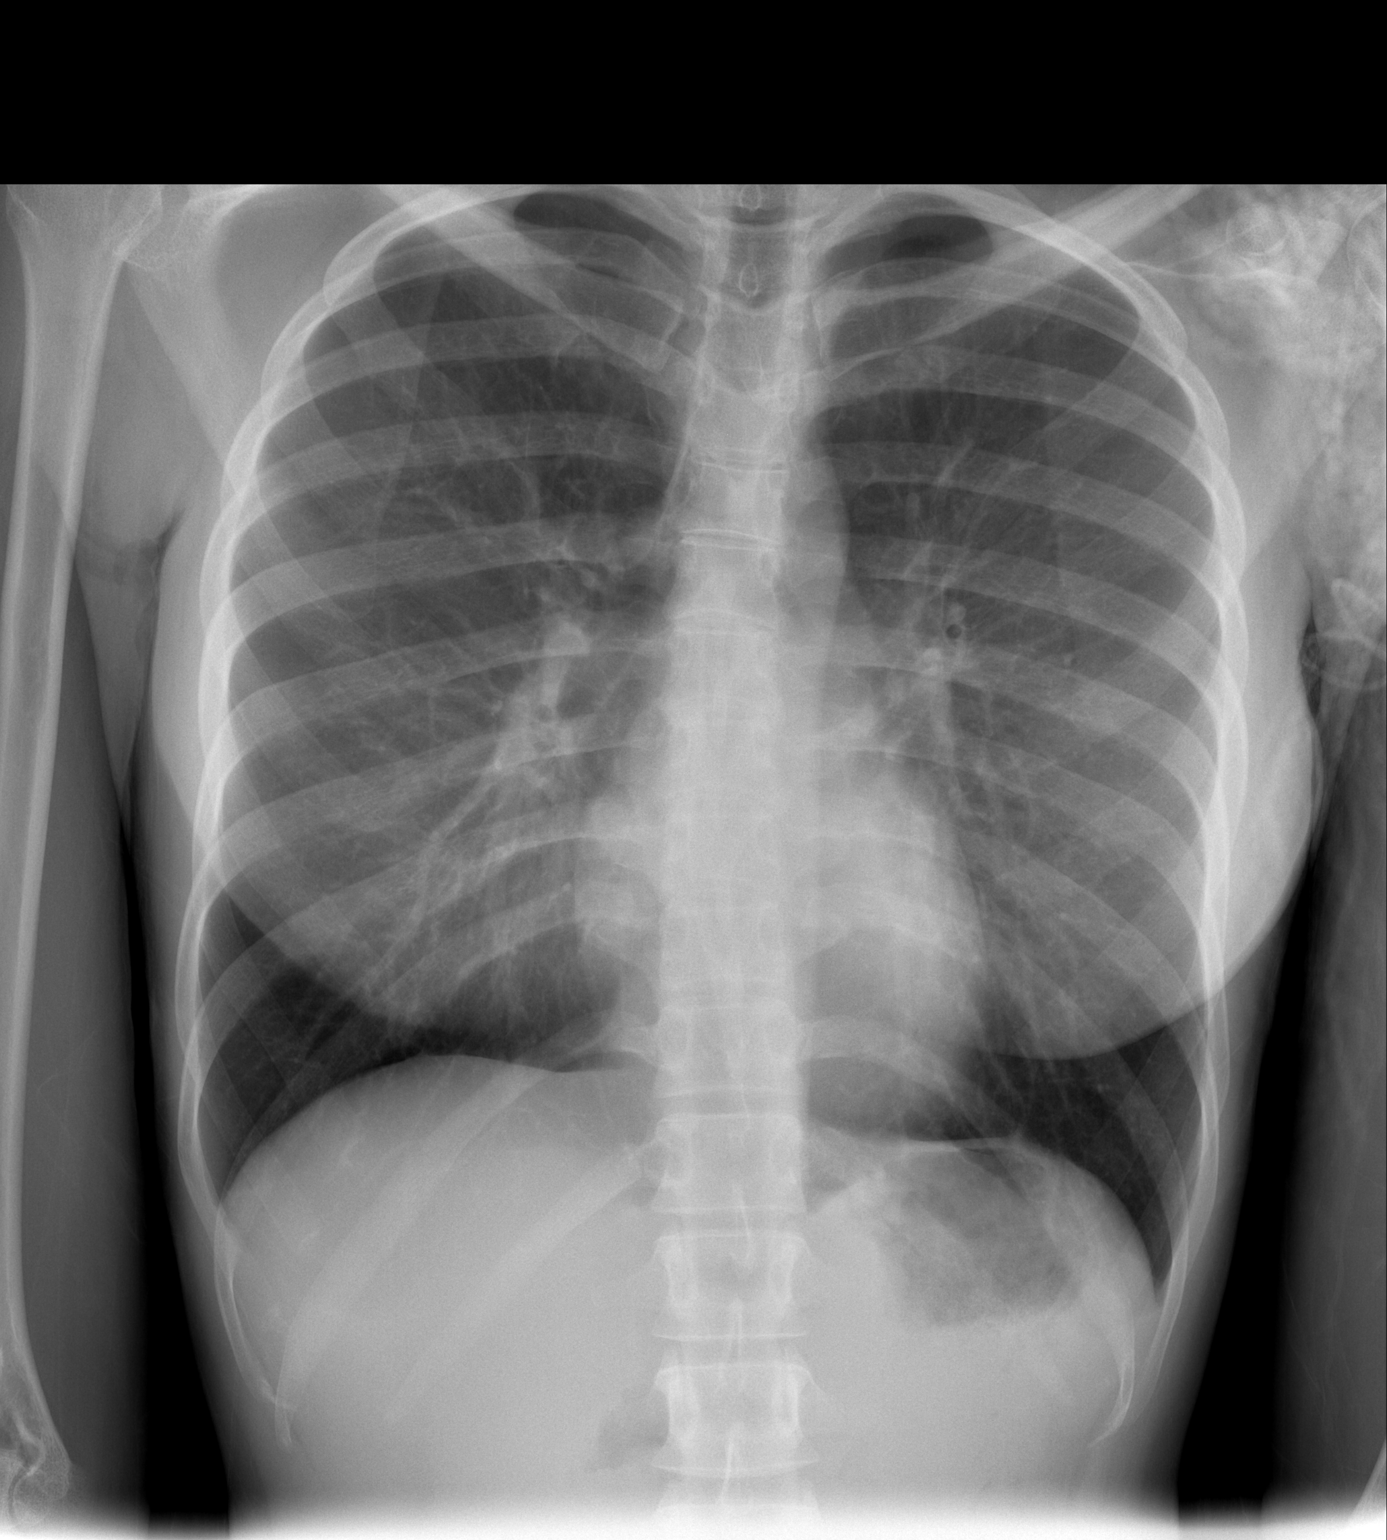

[w chest lat]
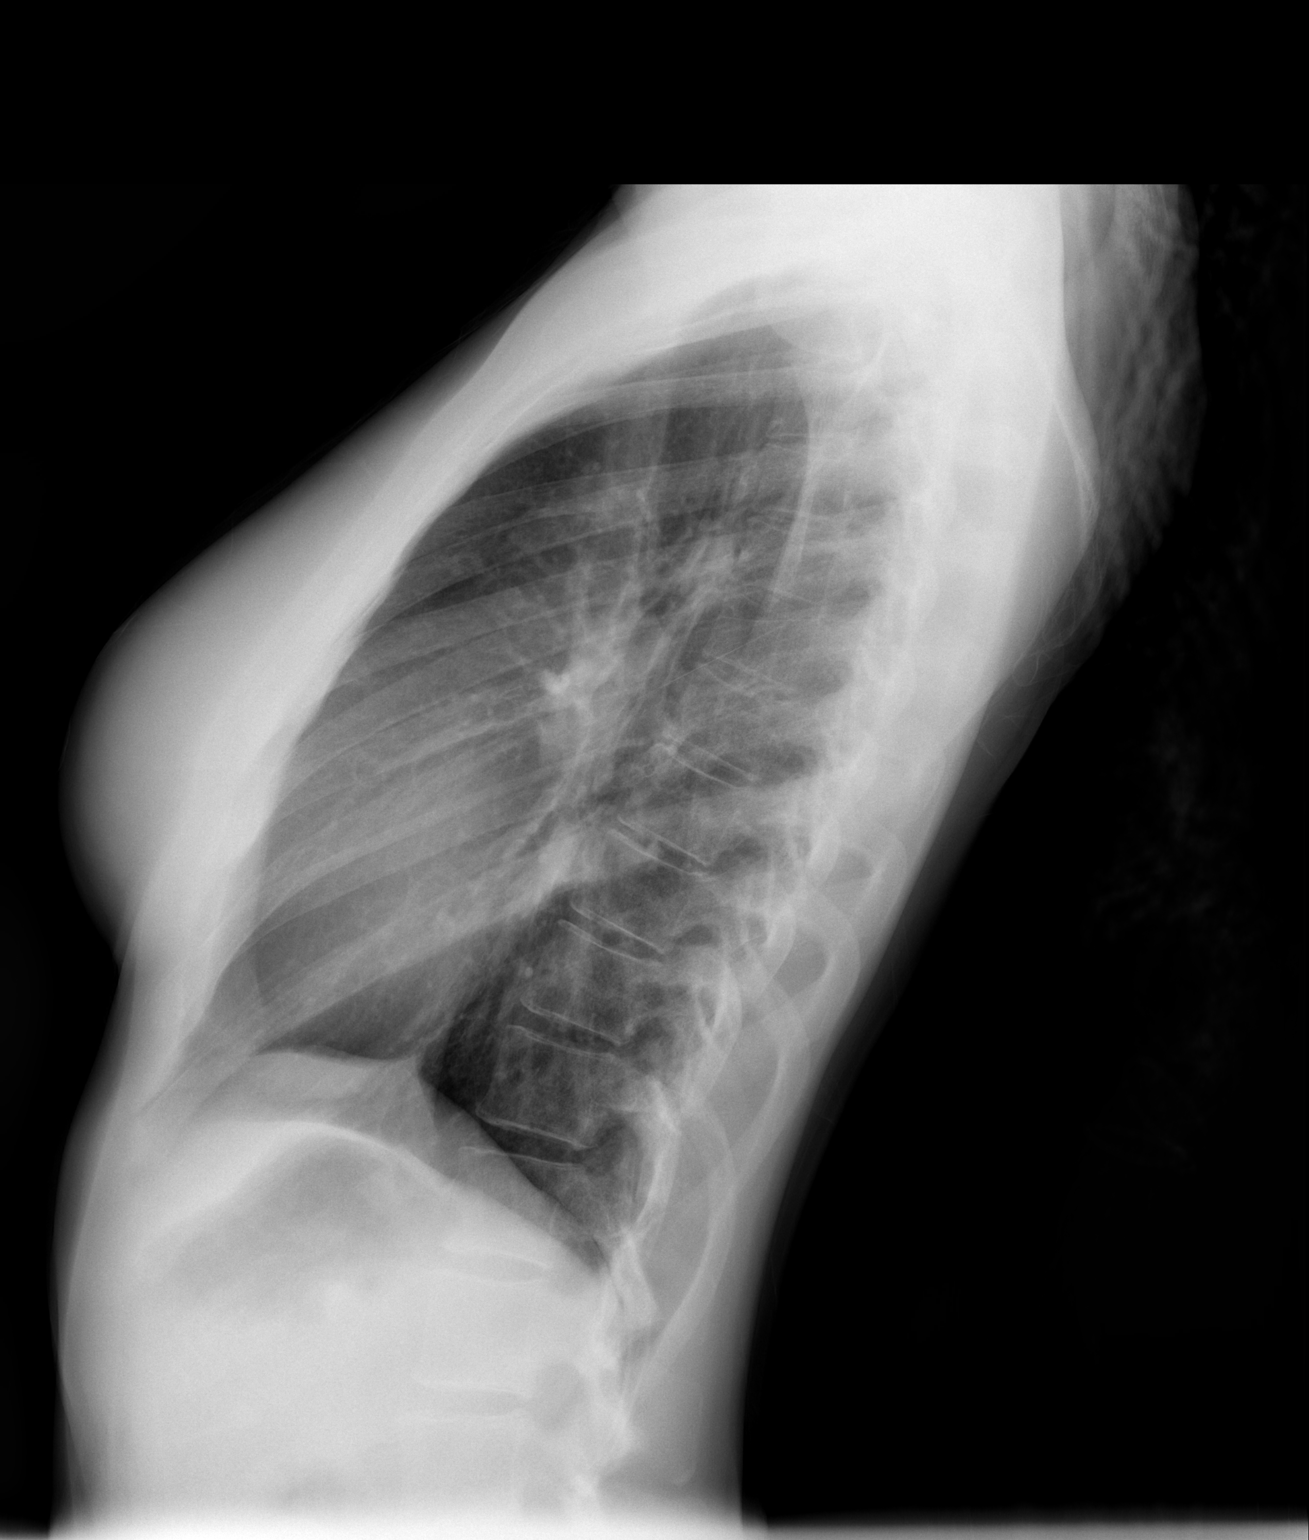

[2 of 2 positions shown; findings below may reference images not displayed]

FINDINGS: Lungs clear.  No pneumothorax or pleural fluid.  Heart
size normal.  No bony abnormality.
IMPRESSION: Normal study.

## 2014-06-13 ENCOUNTER — Encounter (HOSPITAL_BASED_OUTPATIENT_CLINIC_OR_DEPARTMENT_OTHER): Payer: Self-pay | Admitting: *Deleted

## 2014-06-13 ENCOUNTER — Emergency Department (HOSPITAL_BASED_OUTPATIENT_CLINIC_OR_DEPARTMENT_OTHER)
Admission: EM | Admit: 2014-06-13 | Discharge: 2014-06-13 | Disposition: A | Discharge status: Home / Self Care | Payer: Managed Care, Other (non HMO) | Attending: Emergency Medicine | Admitting: Emergency Medicine

## 2014-06-13 DIAGNOSIS — Z79899 Other long term (current) drug therapy: Secondary | ICD-10-CM | POA: Insufficient documentation

## 2014-06-13 DIAGNOSIS — Z7952 Long term (current) use of systemic steroids: Secondary | ICD-10-CM | POA: Insufficient documentation

## 2014-06-13 DIAGNOSIS — L03211 Cellulitis of face: Secondary | ICD-10-CM

## 2014-06-13 DIAGNOSIS — Z87891 Personal history of nicotine dependence: Secondary | ICD-10-CM | POA: Insufficient documentation

## 2014-06-13 DIAGNOSIS — L0201 Cutaneous abscess of face: Secondary | ICD-10-CM | POA: Insufficient documentation

## 2014-06-13 DIAGNOSIS — Z792 Long term (current) use of antibiotics: Secondary | ICD-10-CM | POA: Insufficient documentation

## 2014-06-13 MED ORDER — SULFAMETHOXAZOLE-TRIMETHOPRIM 800-160 MG PO TABS: 1.0000 | ORAL_TABLET | Freq: Two times a day (BID) | ORAL | Status: DC

## 2014-06-13 MED ORDER — CEPHALEXIN 500 MG PO CAPS: 500.0000 mg | ORAL_CAPSULE | Freq: Four times a day (QID) | ORAL | Status: DC

## 2014-06-13 NOTE — ED Notes (Signed)
Pt has an abscess area on her right face x2 days.

## 2014-06-13 NOTE — Discharge Instructions (Signed)
Cellulitis °Cellulitis is an infection of the skin and the tissue beneath it. The infected area is usually red and tender. Cellulitis occurs most often in the arms and lower legs.  °CAUSES  °Cellulitis is caused by bacteria that enter the skin through cracks or cuts in the skin. The most common types of bacteria that cause cellulitis are staphylococci and streptococci. °SIGNS AND SYMPTOMS  °· Redness and warmth. °· Swelling. °· Tenderness or pain. °· Fever. °DIAGNOSIS  °Your health care provider can usually determine what is wrong based on a physical exam. Blood tests may also be done. °TREATMENT  °Treatment usually involves taking an antibiotic medicine. °HOME CARE INSTRUCTIONS  °· Take your antibiotic medicine as directed by your health care provider. Finish the antibiotic even if you start to feel better. °· Keep the infected arm or leg elevated to reduce swelling. °· Apply a warm cloth to the affected area up to 4 times per day to relieve pain. °· Take medicines only as directed by your health care provider. °· Keep all follow-up visits as directed by your health care provider. °SEEK MEDICAL CARE IF:  °· You notice red streaks coming from the infected area. °· Your red area gets larger or turns dark in color. °· Your bone or joint underneath the infected area becomes painful after the skin has healed. °· Your infection returns in the same area or another area. °· You notice a swollen bump in the infected area. °· You develop new symptoms. °· You have a fever. °SEEK IMMEDIATE MEDICAL CARE IF:  °· You feel very sleepy. °· You develop vomiting or diarrhea. °· You have a general ill feeling (malaise) with muscle aches and pains. °MAKE SURE YOU:  °· Understand these instructions. °· Will watch your condition. °· Will get help right away if you are not doing well or get worse. °Document Released: 03/25/2005 Document Revised: 10/30/2013 Document Reviewed: 08/31/2011 °ExitCare® Patient Information ©2015 [employer].  This information is not intended to replace advice given to you by your health care provider. Make sure you discuss any questions you have with your health care provider. ° ° °Emergency Department Resource Guide °1) Find a Doctor and Pay Out of Pocket °Although you won't have to find out who is covered by your insurance plan, it is a good idea to ask around and get recommendations. You will then need to call the office and see if the doctor you have chosen will accept you as a new patient and what types of options they offer for patients who are self-pay. Some doctors offer discounts or will set up payment plans for their patients who do not have insurance, but you will need to ask so you aren't surprised when you get to your appointment. ° °2) Contact Your Local Health Department °Not all health departments have doctors that can see patients for sick visits, but many do, so it is worth a call to see if yours does. If you don't know where your local health department is, you can check in your phone book. The CDC also has a tool to help you locate your state's health department, and many state websites also have listings of all of their local health departments. ° °3) Find a Walk-in Clinic °If your illness is not likely to be very severe or complicated, you may want to try a walk in clinic. These are popping up all over the country in pharmacies, drugstores, and shopping centers. They're usually staffed by nurse practitioners   or physician assistants that have been trained to treat common illnesses and complaints. They're usually fairly quick and inexpensive. However, if you have serious medical issues or chronic medical problems, these are probably not your best option. ° °No Primary Care Doctor: °- Call [facility] at  832-8000 - they can help you locate a primary care doctor that  accepts your insurance, provides certain services, etc. °- Physician Referral Service- 1-800-533-3463 ° °Chronic Pain  Problems: °Organization         Address  Phone   Notes  °[facility]  (336) 297-2271 Patients need to be referred by their primary care doctor.  ° °Medication Assistance: °Organization         Address  Phone   Notes  °[place] [facility] [address] °[place], [place] [address] (336) 641-8030 --Must be a resident of [place] °-- Must have NO insurance coverage whatsoever (no Medicaid/ Medicare, etc.) °-- The pt. MUST have a primary care doctor that directs their care regularly and follows them in the community °  °[facility]  (866) 331-1348   °[employer]  (888) 892-1162   ° °Agencies that provide inexpensive medical care: °Organization         Address  Phone   Notes  °[facility]  (336) 832-8035   °[facility]    (336) 832-7272   °[facility] [address] °[place], [place] [address] (336) 832-4777   °[facility] [address], °[place] (336) 271-4999   °[facility]    (336) 373-0678   °[facility]    (336) 272-1050   °[facility] ° [address], [place] Phone:  (336) 832-4444, Fax:  (336) 832-4440 Hours of Operation:  9 am - 6 pm, M-F.  Also accepts Medicaid/Medicare and self-pay.  °[facility] ° [address], [place] Phone: (336) [phone], Fax: (336) [phone]. Hours of Operation:  8:30 am - 5:30 pm, M-F.  Also accepts Medicaid and self-pay.  °[facility] [place] [address], [place] Phone: (336) 878-6027   °[facility] [address], [place], [place] (336)723-1848, Ext. 123 Mondays & Thursdays: 7-9 AM.  First 15 patients are seen on a first come, first serve basis. °  ° °Medicaid-accepting [place] Providers: ° °Organization         Address  Phone   Notes  °[facility] [address], [place] (336) 641-2100 Also  accepts self-pay patients.  °[facility] [address], [place] ° (336) 856-9996   °[facility] [address], [place] (336) 288-8857   °[facility] [address] [place] [address], [place] (336) 299-7000   °Veita Bland [address], [place]  ° (336) 373-1557 Only accepts [place] Access Medicaid patients after they have their name applied to their card.  ° °Self-Pay (no insurance) in [place]: ° °Organization         Address  Phone   Notes  °Sickle Cell Patients, [facility] [address], [place] (336) 832-1970   °[facility] [address], [place] (336) 832-4400   °[facility] ° [address], [place] (336) 992-4800   °[facility]/Dr. Osei-Bonsu ° [address]   [place] [address], [place] or [address], [place] (336) [phone] Phone number for both [place] and [place] locations is the same.  °[facility] [address], [place] (336) 299-0000   °[facility] [address] [place] [address], [place] or [address] (336) 852-7530 °(336) 878-2260   °[facility] [address], [place] (336) 350-1642, phone; (336) 294-5005, fax Sees patients 1st and 3rd Saturday of every month.  Must not qualify for public or private insurance (i.e. Medicaid, Medicare, [place] Health Choice, Veterans' Benefits) • Household income should be no more than 200% of the poverty level •The clinic cannot treat you if you are pregnant or think you are pregnant • Sexually transmitted diseases are not treated at the clinic.  ° ° °Dental Care: °Organization         Address  Phone  Notes  °[facility] [address], [place] (336) 641-6152 Accepts children up to age 21 who are enrolled in Medicaid or [place] Health Choice; pregnant  women with a Medicaid card; and children who have applied for Medicaid or [place] Health Choice, but were declined, whose parents can pay a reduced fee at time of service.  °[facility] [place]  [address], [place] (336) 641-7733 Accepts children up to age 21 who are enrolled in Medicaid or [place] Health Choice; pregnant women with a Medicaid card; and children who have applied for Medicaid or [place] Health Choice, but were declined, whose parents can pay a reduced fee at time of service.  °[facility] ° [address], [place] (336) 641-4533 Patients are seen by appointment only. Walk-ins are not accepted. [facility] will see patients 18 years of age and older. °Monday - Tuesday (8am-5pm) °Most Wednesdays (8:30-5pm) °$30 per visit, cash only  °[facility] ° [address], [place] (336) 641-4533 Patients are seen by appointment only. Walk-ins are not accepted. [facility] will see patients 18 years of age and older. °One Wednesday Evening (Monthly: Volunteer Based).  $30 per visit, cash only  °[facility]  (919) 537-3737 for adults; Children under age 4, call Graduate Pediatric Dentistry at (919) [phone]. Children aged 4-14, please call (919) [phone] to request a pediatric application. ° Dental services are provided in all areas of dental care including fillings, crowns and bridges, complete and partial dentures, implants, gum treatment, root canals, and extractions. Preventive care is also provided. Treatment is provided to both adults and children. °Patients are selected via a lottery and there is often a waiting list. °  °[facility] [address], °[place] ° (336) 763-8833 [email or web address] °  °[facility] [address], [place] (336)723-1848, Ext. 123 Second and Fourth Thursday of each month, opens at 6:30 AM; Clinic ends at 9 AM.  Patients are  seen on a first-come first-served basis, and a limited number are seen during each clinic.  ° °[facility] ° [address], [place], [place] (336) 723-7904   Eligibility Requirements °You must have lived in [place], [place], or [place] counties for at least the last three months. °  You cannot be eligible for state or federal sponsored healthcare insurance, including Veterans Administration, Medicaid, or Medicare. °  You generally cannot be eligible for healthcare insurance through your employer.  °    How to apply: °Eligibility screenings are held every Tuesday and Wednesday afternoon from 1:00 pm until 4:00 pm. You do not need an appointment for the interview!  °[facility] [address], [place], [place] 336-631-2330   °[facility]  336-342-8273   °[facility]  336-703-3100   °[facility]  336-570-6415   ° °Behavioral Health Resources in the Community: °Intensive Outpatient Programs °Organization         Address  Phone  Notes  °[facility] [address], [place], [place] 336-878-6098   °[facility] [address], [place], [place] 336-832-9800   °[facility] [address], [place], [place] ° 336-882-2125   °[facility] [address],  °[place], [place] 1-800-853-5163 or 336-641-4981   °Substance Abuse Resources °Organization         Address  Phone  Notes  °[facility]  336-882-2125   °[facility]  336-784-9470   °[facility]  336-285-9073   °[facility]  336-845-3988   °Residential & Outpatient Substance Abuse Program  1-800-659-3381   °Psychological Services °Organization         Address  Phone  Notes  ° Health  336- 832-9600   °[facility]  336- 378-7881   °[facility] [address], [place] 1-800-853-5163 or 336-641-4981   ° °Mobile Crisis  Teams °Organization         Address  Phone  Notes  °[facility], Mobile Crisis Care Unit  1-877-626-1772   °Assertive °[facility] ° [address] [place], [place] 336-834-9664   °Sharon DeEsch [address] °[place] 336-554-5454   ° °Self-Help/Support Groups °Organization         Address  Phone             Notes  °[facility] - variety of support groups  336- 373-1402 Call for more information  °Narcotics Anonymous (NA), [facility] [address], °[place]  2 meetings at this location  ° °Residential Treatment Programs °Organization         Address  Phone  Notes  °[facility] [address],    °[place]  1-866-801-8205   °[facility] ° [address], [place], [place] 704-293-8524   °[facility] [address], [place] 336-845-3988 Admissions: 8am-3pm M-F  °[facility] [address]    °[place], [place] 336-841-1104   °[facility] [address], [place], [place] 336-379-7146   °[facility] [address]  °[place], [place] 336-285-9073   °[facility] Intensive Outpatient [address], [place], [place] 336-852-3033   °[facility] [address]  °[place], [place] 1-877-615-2722 or 336-784-9470   °[facility] [address] [place], [place] 336-227-7417 Accepts Medicaid  °[facility] [address]  °[place] 1-800-659-3381 Substance Abuse/Addiction Treatment  ° °[place] Behavioral Health Resources °Organization         Address  Phone  Notes  °[facility]  (888) 581-9988   °Julie Brannon, PhD [address] [place], [place]   (336) 349-5553 or (336) 951-0000   °[facility]   [address] °[place],  (336) 349-4454   °[facility] [address],   [place], [place] (336) 342-8316  Insurance/Medicaid/sponsorship through [facility]  °[facility] [address]                                    [place], [place] (336) 342-8316 Therapy/tele-psych/case  °[facility] [address]  ° [place], [place] (336) 349-2233    °Dr. Arfeen  (336) 349-4544   °[facility] [place]  [employer] [place] [facility] 1) [address], [place] °2) [address], [place] °3)  [address], [place] (336) 349-3220 °(336) 342-7768 ° °(336) 342-8140   °[place] Child Abuse Hot[facility] (336) 342-1394 or (336) 342-3537 (After Hours)    ° ° ° °

## 2014-06-15 NOTE — ED Provider Notes (Signed)
CSN: [id number]     Arrival date & time 06/13/14  1016 History   First MD Initiated Contact with Patient 06/13/14 1038     Chief Complaint  Patient presents with  . Abscess     (Consider location/radiation/quality/duration/timing/severity/associated sxs/prior Treatment) Patient is a 28 y.o. female presenting with rash.  Rash Location: R face. Quality: painful, redness and weeping   Pain details:    Quality:  Aching   Severity:  Moderate   Onset quality:  Gradual   Duration:  2 days   Timing:  Constant   Progression:  Worsening Severity:  Moderate Onset quality:  Gradual Timing:  Constant Progression:  Worsening Chronicity:  New Context: not animal contact and not insect bite/sting   Relieved by:  Nothing Worsened by:  Nothing tried Associated symptoms: no abdominal pain, no fever, no nausea and not vomiting     Past Medical History  Diagnosis Date  . No pertinent past medical history    Past Surgical History  Procedure Laterality Date  . No past surgeries     Family History  Problem Relation Age of Onset  . Hypertension Maternal Grandmother   . Diabetes Paternal Grandmother   . Anesthesia problems Neg Hx    History  Substance Use Topics  . Smoking status: Former Games developermoker  . Smokeless tobacco: Not on file  . Alcohol Use: Yes     Comment: States has stopped for a month now   OB History    Gravida Para Term Preterm AB TAB SAB Ectopic Multiple Living   0 0 0 0 0 0 0 0 0 0      Review of Systems  Constitutional: Negative for fever.  Gastrointestinal: Negative for nausea, vomiting and abdominal pain.  Skin: Positive for rash.  All other systems reviewed and are negative.     Allergies  Review of patient's allergies indicates no known allergies.  Home Medications   Prior to Admission medications   Medication Sig Start Date End Date Taking? Authorizing Provider  acetaminophen (TYLENOL) 500 MG tablet Take 1,000 mg by mouth every 6 (six) hours as needed.  fever    Historical Provider, MD  benzonatate (TESSALON) 100 MG capsule Take 1 capsule (100 mg total) by mouth every 8 (eight) hours. 06/17/12   John-Adam Bonk, MD  cephALEXin (KEFLEX) 500 MG capsule Take 1 capsule (500 mg total) by mouth 4 (four) times daily. 06/13/14   Mirian MoMatthew Saul Dorsi, MD  Dextromethorphan-Guaifenesin (ROBITUSSIN DM) 10-100 MG/5ML liquid Take 10 mLs by mouth every 12 (twelve) hours.    Historical Provider, MD  diphenhydrAMINE (BENADRYL) 25 MG tablet Take 25 mg by mouth every 6 (six) hours as needed for itching.    Historical Provider, MD  DM-Phenylephrine-Acetaminophen (ALKA-SELTZER PLS SINUS & COUGH) 10-5-325 MG CAPS Take 2 capsules by mouth at bedtime.    Historical Provider, MD  ferrous gluconate (FERGON) 325 MG tablet Take 325 mg by mouth daily with breakfast.    Historical Provider, MD  fluticasone (CUTIVATE) 0.05 % cream Apply topically 2 (two) times daily. 08/21/12   Linna HoffJames D Kindl, MD  guaiFENesin (MUCINEX) 600 MG 12 hr tablet Take 2 tablets (1,200 mg total) by mouth 2 (two) times daily. 06/17/12   John-Adam Bonk, MD  ibuprofen (ADVIL,MOTRIN) 400 MG tablet Take 1 tablet (400 mg total) by mouth every 6 (six) hours as needed for pain. 06/17/12   John-Adam Bonk, MD  Multiple Vitamins-Minerals (MULTIVITAMIN WITH MINERALS) tablet Take 1 tablet by mouth daily.  Historical Provider, MD  omeprazole (PRILOSEC) 20 MG capsule Take 1 capsule (20 mg total) by mouth daily. 11/30/12   Glade NurseBarbara Beck, PA-C  ondansetron (ZOFRAN ODT) 4 MG disintegrating tablet Take 1 tablet (4 mg total) by mouth every 8 (eight) hours as needed for nausea. 11/30/12   Glade NurseBarbara Beck, PA-C  sulfamethoxazole-trimethoprim (BACTRIM DS,SEPTRA DS) 800-160 MG per tablet Take 1 tablet by mouth 2 (two) times daily. 06/13/14   Mirian MoMatthew Beatriz Settles, MD   BP 135/89 mmHg  Pulse 91  Temp(Src) 98.4 F (36.9 C) (Oral)  Resp 16  Ht 5\' 8"  (1.727 m)  Wt 135 lb (61.236 kg)  BMI 20.53 kg/m2  SpO2 100%  LMP 05/28/2014 Physical Exam   Constitutional: She is oriented to person, place, and time. She appears well-developed and well-nourished.  HENT:  Head: Normocephalic and atraumatic.  Right Ear: External ear normal.  Left Ear: External ear normal.  Eyes: Conjunctivae and EOM are normal. Pupils are equal, round, and reactive to light.  Neck: Normal range of motion. Neck supple.  Cardiovascular: Normal rate, regular rhythm, normal heart sounds and intact distal pulses.   Pulmonary/Chest: Effort normal and breath sounds normal.  Abdominal: Soft. Bowel sounds are normal. There is no tenderness.  Musculoskeletal: Normal range of motion.  Neurological: She is alert and oriented to person, place, and time.  Skin: Skin is warm and dry.  Erythema and blistering over R zygoma  Vitals reviewed.   ED Course  Procedures (including critical care time) Labs Review Labs Reviewed - No data to display  Imaging Review No results found.   EKG Interpretation None     ULTRASOUND LIMITED SOFT TISSUE/ MUSCULOSKELETAL: R face Indication: swelling, erythema, ro abscess Linear probe used to evaluate area of interest in two planes. Findings:  No discernable fluid collection but with cobblestoning Performed by: Dr Littie DeedsGentry Images NOT saved electronically  MDM   Final diagnoses:  Cellulitis of face    28 y.o. female without pertinent PMH presents with cellulitis to R face.  No systemic symptoms.  No abscess discernable on US.  DC home with bactrim/keflex and strict return precautions.    I have reviewed all laboratory and imaging studies if ordered as above  1. Cellulitis of face         Mirian MoMatthew Belton Peplinski, MD 06/15/14 1136

## 2015-07-02 ENCOUNTER — Encounter (HOSPITAL_BASED_OUTPATIENT_CLINIC_OR_DEPARTMENT_OTHER): Payer: Self-pay

## 2015-07-02 ENCOUNTER — Emergency Department (HOSPITAL_BASED_OUTPATIENT_CLINIC_OR_DEPARTMENT_OTHER)
Admission: EM | Admit: 2015-07-02 | Discharge: 2015-07-02 | Disposition: A | Discharge status: Home / Self Care | Payer: Managed Care, Other (non HMO) | Attending: Emergency Medicine | Admitting: Emergency Medicine

## 2015-07-02 DIAGNOSIS — Z3202 Encounter for pregnancy test, result negative: Secondary | ICD-10-CM | POA: Insufficient documentation

## 2015-07-02 DIAGNOSIS — Z792 Long term (current) use of antibiotics: Secondary | ICD-10-CM | POA: Insufficient documentation

## 2015-07-02 DIAGNOSIS — L509 Urticaria, unspecified: Secondary | ICD-10-CM | POA: Insufficient documentation

## 2015-07-02 DIAGNOSIS — K644 Residual hemorrhoidal skin tags: Secondary | ICD-10-CM

## 2015-07-02 DIAGNOSIS — Z79899 Other long term (current) drug therapy: Secondary | ICD-10-CM | POA: Insufficient documentation

## 2015-07-02 DIAGNOSIS — F17200 Nicotine dependence, unspecified, uncomplicated: Secondary | ICD-10-CM | POA: Insufficient documentation

## 2015-07-02 LAB — PREGNANCY, URINE: Preg Test, Ur: NEGATIVE

## 2015-07-02 MED ORDER — PREDNISONE 10 MG PO TABS: 20.0000 mg | ORAL_TABLET | Freq: Two times a day (BID) | ORAL | Status: AC

## 2015-07-02 MED ORDER — RANITIDINE HCL 150 MG PO CAPS: 150.0000 mg | ORAL_CAPSULE | Freq: Every day | ORAL | Status: DC

## 2015-07-02 MED ORDER — PREDNISONE 20 MG PO TABS
40.0000 mg | ORAL_TABLET | Freq: Once | ORAL | Status: AC
  Administered 2015-07-02: 40 mg via ORAL
  Filled 2015-07-02: qty 2

## 2015-07-02 MED ORDER — HYDROXYZINE HCL 50 MG PO TABS: 50.0000 mg | ORAL_TABLET | Freq: Three times a day (TID) | ORAL | Status: DC | PRN

## 2015-07-02 MED ORDER — RANITIDINE HCL 150 MG/10ML PO SYRP
150.0000 mg | ORAL_SOLUTION | Freq: Once | ORAL | Status: AC
  Administered 2015-07-02: 150 mg via ORAL
  Filled 2015-07-02: qty 10

## 2015-07-02 MED ORDER — HYDROXYZINE HCL 25 MG PO TABS
50.0000 mg | ORAL_TABLET | Freq: Once | ORAL | Status: AC
  Administered 2015-07-02: 50 mg via ORAL
  Filled 2015-07-02: qty 2

## 2015-07-02 NOTE — ED Provider Notes (Signed)
CSN: [id number]     Arrival date & time 07/02/15  4098 History   First MD Initiated Contact with Patient 07/02/15 0701     Chief Complaint  Patient presents with  . Rash     (Consider location/radiation/quality/duration/timing/severity/associated sxs/prior Treatment) HPI Comments: Started 12/22, left for cruise next day Thought they were hives at first Coming and going in different location Pruritic then scratch them and rasises and starts to look like boil, puss First started wrist and back Now located over arms, scalp, finger, back of neck, ankles Getting worse No contacts with rash Was taking benadryl which helped itching but spots still showed up No new medicines, no new detergents/clothing  Hemorrhoid x 2 days, having rectal pain, got hemorrhoid cream and went down, soreness LMP 12/1, spotting now, gave vitamins at health dept     Patient is a 30 y.o. female presenting with rash.  Rash Associated symptoms: no abdominal pain, no fever, no headaches, no nausea, no shortness of breath, no sore throat and not vomiting     Past Medical History  Diagnosis Date  . No pertinent past medical history    Past Surgical History  Procedure Laterality Date  . No past surgeries     Family History  Problem Relation Age of Onset  . Hypertension Maternal Grandmother   . Diabetes Paternal Grandmother   . Anesthesia problems Neg Hx    Social History  Substance Use Topics  . Smoking status: Current Every Day Smoker  . Smokeless tobacco: None  . Alcohol Use: Yes     Comment: occasional    OB History    Gravida Para Term Preterm AB TAB SAB Ectopic Multiple Living   0 0 0 0 0 0 0 0 0 0      Review of Systems  Constitutional: Negative for fever.  HENT: Negative for sore throat.   Eyes: Negative for visual disturbance.  Respiratory: Negative for cough and shortness of breath.   Cardiovascular: Negative for chest pain.  Gastrointestinal: Negative for nausea, vomiting and  abdominal pain.  Genitourinary: Negative for difficulty urinating.  Musculoskeletal: Negative for back pain and neck pain.  Skin: Positive for rash.  Neurological: Negative for syncope and headaches.      Allergies  Review of patient's allergies indicates no known allergies.  Home Medications   Prior to Admission medications   Medication Sig Start Date End Date Taking? Authorizing Provider  acetaminophen (TYLENOL) 500 MG tablet Take 1,000 mg by mouth every 6 (six) hours as needed. fever    Historical Provider, MD  benzonatate (TESSALON) 100 MG capsule Take 1 capsule (100 mg total) by mouth every 8 (eight) hours. 06/17/12   John-Adam Bonk, MD  cephALEXin (KEFLEX) 500 MG capsule Take 1 capsule (500 mg total) by mouth 4 (four) times daily. 06/13/14   Mirian Mo, MD  Dextromethorphan-Guaifenesin (ROBITUSSIN DM) 10-100 MG/5ML liquid Take 10 mLs by mouth every 12 (twelve) hours.    Historical Provider, MD  diphenhydrAMINE (BENADRYL) 25 MG tablet Take 25 mg by mouth every 6 (six) hours as needed for itching.    Historical Provider, MD  DM-Phenylephrine-Acetaminophen (ALKA-SELTZER PLS SINUS & COUGH) 10-5-325 MG CAPS Take 2 capsules by mouth at bedtime.    Historical Provider, MD  ferrous gluconate (FERGON) 325 MG tablet Take 325 mg by mouth daily with breakfast.    Historical Provider, MD  fluticasone (CUTIVATE) 0.05 % cream Apply topically 2 (two) times daily. 08/21/12   Linna Hoff, MD  guaiFENesin [facility])  600 MG 12 hr tablet Take 2 tablets (1,200 mg total) by mouth 2 (two) times daily. 06/17/12   John-Adam Bonk, MD  hydrOXYzine (ATARAX/VISTARIL) 50 MG tablet Take 1 tablet (50 mg total) by mouth 3 (three) times daily as needed for itching. 07/02/15   Alvira Monday, MD  ibuprofen (ADVIL,MOTRIN) 400 MG tablet Take 1 tablet (400 mg total) by mouth every 6 (six) hours as needed for pain. 06/17/12   John-Adam Bonk, MD  Multiple Vitamins-Minerals (MULTIVITAMIN WITH MINERALS) tablet Take 1  tablet by mouth daily.      Historical Provider, MD  omeprazole (PRILOSEC) 20 MG capsule Take 1 capsule (20 mg total) by mouth daily. 11/30/12   Lowell Bouton, PA-C  ondansetron (ZOFRAN ODT) 4 MG disintegrating tablet Take 1 tablet (4 mg total) by mouth every 8 (eight) hours as needed for nausea. 11/30/12   Lowell Bouton, PA-C  predniSONE (DELTASONE) 10 MG tablet Take 2 tablets (20 mg total) by mouth 2 (two) times daily with a meal. 07/02/15 07/05/15  Alvira Monday, MD  ranitidine (ZANTAC) 150 MG capsule Take 1 capsule (150 mg total) by mouth daily. 07/02/15   Alvira Monday, MD  sulfamethoxazole-trimethoprim (BACTRIM DS,SEPTRA DS) 800-160 MG per tablet Take 1 tablet by mouth 2 (two) times daily. 06/13/14   Mirian Mo, MD   BP 116/69 mmHg  Pulse 72  Temp(Src) 98.6 F (37 C) (Oral)  Resp 18  Ht 5\' 8"  (1.727 m)  Wt 135 lb (61.236 kg)  BMI 20.53 kg/m2  SpO2 99%  LMP 06/25/2015 Physical Exam  Constitutional: She is oriented to person, place, and time. She appears well-developed and well-nourished. No distress.  HENT:  Head: Normocephalic and atraumatic.  Eyes: Conjunctivae and EOM are normal.  Neck: Normal range of motion.  Cardiovascular: Normal rate, regular rhythm, normal heart sounds and intact distal pulses.  Exam reveals no gallop and no friction rub.   No murmur heard. Pulmonary/Chest: Effort normal and breath sounds normal. No respiratory distress. She has no wheezes. She has no rales.  Abdominal: Soft. She exhibits no distension. There is no tenderness. There is no guarding.  Genitourinary: Rectal exam shows external hemorrhoid and tenderness.  Musculoskeletal: She exhibits no edema or tenderness.  Neurological: She is alert and oriented to person, place, and time.  Skin: Skin is warm and dry. Rash (erythematous wheals over arms, ankle, back of neck) noted. She is not diaphoretic. No erythema.  Nursing note and vitals reviewed.   ED Course  Procedures (including critical care  time) Labs Review Labs Reviewed  PREGNANCY, URINE    Imaging Review No results found. I have personally reviewed and evaluated these images and lab results as part of my medical decision-making.   EKG Interpretation None      MDM   Final diagnoses:  Hives  External hemorrhoid   30 year old female with no significant medical history presents with concern of rash.  Rash does not have the appearance of SSS, TEN, erythroderma, scabies, RMSF, cellulitis or abscess. Upreg negative. Appearance of wheals, as well as history of them coming and going in different locations is most consistent with hives, however trigger is unknown.  There appears to be area of trauma with scratching causing skin trauma/mild infection with resolution on several of the areas however no sign of current cellulitis or abscess.  No history to suggest anaphylaxis.  Gave rx for ranitidine, atarax, prednisone  and recommend examination for possible triggers.    Patient with hemorrhoids without sign of  abscess or thrombosis and discussed hemorrhoid care.   Patient discharged in stable condition with understanding of reasons to return.       Alvira MondayErin Jahmia Berrett, MD 07/02/15 [phone]

## 2015-07-02 NOTE — ED Notes (Signed)
Pt here with 1 week history of rash to bilateral arms, back, abdomen, ankles, scalp. Itching worse at night. [place] yesterday since 12/23 - states that rash developed on 12/22. Pt also states, "I developed a hemorohoid yesterday, and I think I may be pregnant," - pt reports intermittent abnormal vaginal bleeding.

## 2015-09-30 ENCOUNTER — Encounter (HOSPITAL_COMMUNITY): Payer: Self-pay

## 2015-09-30 ENCOUNTER — Inpatient Hospital Stay (HOSPITAL_COMMUNITY)
Admission: AD | Admit: 2015-09-30 | Discharge: 2015-09-30 | Disposition: A | Discharge status: Home / Self Care | Payer: BLUE CROSS/BLUE SHIELD | Source: Ambulatory Visit | Attending: Obstetrics and Gynecology | Admitting: Obstetrics and Gynecology

## 2015-09-30 DIAGNOSIS — N912 Amenorrhea, unspecified: Secondary | ICD-10-CM | POA: Diagnosis present

## 2015-09-30 DIAGNOSIS — N926 Irregular menstruation, unspecified: Secondary | ICD-10-CM

## 2015-09-30 DIAGNOSIS — F17200 Nicotine dependence, unspecified, uncomplicated: Secondary | ICD-10-CM | POA: Insufficient documentation

## 2015-09-30 DIAGNOSIS — R350 Frequency of micturition: Secondary | ICD-10-CM | POA: Diagnosis not present

## 2015-09-30 DIAGNOSIS — Z79899 Other long term (current) drug therapy: Secondary | ICD-10-CM | POA: Diagnosis not present

## 2015-09-30 LAB — WET PREP, GENITAL
SPERM: NONE SEEN
TRICH WET PREP: NONE SEEN
YEAST WET PREP: NONE SEEN

## 2015-09-30 LAB — URINALYSIS, ROUTINE W REFLEX MICROSCOPIC
Bilirubin Urine: NEGATIVE
GLUCOSE, UA: NEGATIVE mg/dL
HGB URINE DIPSTICK: NEGATIVE
Ketones, ur: NEGATIVE mg/dL
Leukocytes, UA: NEGATIVE
Nitrite: NEGATIVE
PH: 5 (ref 5.0–8.0)
Protein, ur: NEGATIVE mg/dL

## 2015-09-30 LAB — POCT PREGNANCY, URINE: Preg Test, Ur: NEGATIVE

## 2015-09-30 NOTE — MAU Note (Signed)
Pt states LMP was 08/26/2015. States that she "feels something moving" and sharp pains in back. Has had some spotting and some watery discharge.

## 2015-09-30 NOTE — MAU Note (Signed)
Pt states her period is late and she has been experiencing pregnancy symptoms. Reports breast heaviness, what feels like fetal movement, increased appetite.

## 2015-09-30 NOTE — MAU Provider Note (Signed)
History     CSN: [id number]  Arrival date [id number] time: 09/30/15 [id number]   First Provider Initiated Contact with Patient 09/30/15 0507      Chief Complaint  Patient presents with  . Amenorrhea   HPI Comments: Shelly York is a 30 y.o. G0P0000 who presents today with a variety of complaints. She states that her LMP was 08/26/15. She states that she has had urinary frequency, irregular periods, gas, bloating, and she is concerned that she may be pregnant. She would like a referal to see why her "body is reacting this way".   Urinary Frequency  This is a new problem. The current episode started in the past 7 days. The problem occurs intermittently. The problem has been unchanged. The pain is mild. There has been no fever. She is sexually active. Associated symptoms include frequency and nausea. Pertinent negatives include no chills, urgency or vomiting. She has tried nothing for the symptoms. The treatment provided no relief.   Past Medical History  Diagnosis Date  . No pertinent past medical history     Past Surgical History  Procedure Laterality Date  . No past surgeries      Family History  Problem Relation Age of Onset  . Hypertension Maternal Grandmother   . Diabetes Paternal Grandmother   . Anesthesia problems Neg Hx     Social History  Substance Use Topics  . Smoking status: Current Every Day Smoker    Types: Cigarettes, Cigars  . Smokeless tobacco: None  . Alcohol Use: Yes     Comment: occasional     Allergies: No Known Allergies  Prescriptions prior to admission  Medication Sig Dispense Refill Last Dose  . acetaminophen (TYLENOL) 500 MG tablet Take 1,000 mg by mouth every 6 (six) hours as needed. fever   12/21/2011 at Unknown  . benzonatate (TESSALON) 100 MG capsule Take 1 capsule (100 mg total) by mouth every 8 (eight) hours. 21 capsule 0   . cephALEXin (KEFLEX) 500 MG capsule Take 1 capsule (500 mg total) by mouth 4 (four) times daily. 28 capsule 0   .  Dextromethorphan-Guaifenesin (ROBITUSSIN DM) 10-100 MG/5ML liquid Take 10 mLs by mouth every 12 (twelve) hours.   12/22/2011 at Unknown  . diphenhydrAMINE (BENADRYL) 25 MG tablet Take 25 mg by mouth every 6 (six) hours as needed for itching.   11/30/2012 at Unknown  . DM-Phenylephrine-Acetaminophen (ALKA-SELTZER PLS SINUS & COUGH) 10-5-325 MG CAPS Take 2 capsules by mouth at bedtime.   12/21/2011 at Unknown  . ferrous gluconate (FERGON) 325 MG tablet Take 325 mg by mouth daily with breakfast.   11/30/2012 at Unknown  . fluticasone (CUTIVATE) 0.05 % cream Apply topically 2 (two) times daily. 30 g 0   . guaiFENesin (MUCINEX) 600 MG 12 hr tablet Take 2 tablets (1,200 mg total) by mouth 2 (two) times daily. 30 tablet 0   . hydrOXYzine (ATARAX/VISTARIL) 50 MG tablet Take 1 tablet (50 mg total) by mouth 3 (three) times daily as needed for itching. 30 tablet 0   . ibuprofen (ADVIL,MOTRIN) 400 MG tablet Take 1 tablet (400 mg total) by mouth every 6 (six) hours as needed for pain. 30 tablet 0   . Multiple Vitamins-Minerals (MULTIVITAMIN WITH MINERALS) tablet Take 1 tablet by mouth daily.     Past Week at Unknown  . omeprazole (PRILOSEC) 20 MG capsule Take 1 capsule (20 mg total) by mouth daily. 14 capsule 0   . ondansetron (ZOFRAN ODT) 4 MG disintegrating tablet Take 1  tablet (4 mg total) by mouth every 8 (eight) hours as needed for nausea. 10 tablet 0   . ranitidine (ZANTAC) 150 MG capsule Take 1 capsule (150 mg total) by mouth daily. 30 capsule 0   . sulfamethoxazole-trimethoprim (BACTRIM DS,SEPTRA DS) 800-160 MG per tablet Take 1 tablet by mouth 2 (two) times daily. 14 tablet 0     Review of Systems  Constitutional: Negative for fever and chills.  Gastrointestinal: Positive for nausea. Negative for vomiting, abdominal pain, diarrhea and constipation.  Genitourinary: Positive for frequency. Negative for dysuria and urgency.   Physical Exam   Blood pressure 138/87, pulse 84, temperature 99 F (37.2 C),  temperature source Oral, resp. rate 16, height  (1.702 m), weight 58.514 kg (129 lb), last menstrual period 08/26/2015, SpO2 100 %.  Physical Exam  Nursing note and vitals reviewed. Constitutional: She is oriented to person, place, and time. She appears well-developed and well-nourished. No distress.  HENT:  Head: Normocephalic.  Cardiovascular: Normal rate.   Respiratory: Effort normal.  GI: Soft. There is no tenderness. There is no rebound.  Neurological: She is alert and oriented to person, place, and time.  Skin: Skin is warm and dry.  Psychiatric: She has a normal mood and affect.    MAU Course  Procedures  MDM   Assessment and Plan   1. Urinary frequency   2. Irregular menstrual cycle    DC home Comfort measures reviewed  RX: none    Follow-up Information    Follow up with [facility].   Specialty:  Obstetrics and Gynecology   Why:  They will call you with an appointment   Contact information:   [address] [place] [address] [phone]        Tawnya Crook 09/30/2015, 5:07 AM

## 2015-10-01 LAB — GC/CHLAMYDIA PROBE AMP (~~LOC~~) NOT AT ARMC
CHLAMYDIA, DNA PROBE: NEGATIVE
NEISSERIA GONORRHEA: NEGATIVE

## 2015-11-01 ENCOUNTER — Encounter: Payer: Managed Care, Other (non HMO) | Admitting: Family Medicine

## 2017-01-27 ENCOUNTER — Inpatient Hospital Stay (HOSPITAL_COMMUNITY)
Admission: AD | Admit: 2017-01-27 | Discharge: 2017-01-27 | Disposition: A | Discharge status: Home / Self Care | Payer: BLUE CROSS/BLUE SHIELD | Source: Ambulatory Visit | Attending: Family Medicine | Admitting: Family Medicine

## 2017-01-27 ENCOUNTER — Encounter (HOSPITAL_COMMUNITY): Payer: Self-pay | Admitting: *Deleted

## 2017-01-27 DIAGNOSIS — M545 Low back pain: Secondary | ICD-10-CM | POA: Insufficient documentation

## 2017-01-27 DIAGNOSIS — F17210 Nicotine dependence, cigarettes, uncomplicated: Secondary | ICD-10-CM | POA: Insufficient documentation

## 2017-01-27 DIAGNOSIS — Z3202 Encounter for pregnancy test, result negative: Secondary | ICD-10-CM | POA: Diagnosis not present

## 2017-01-27 DIAGNOSIS — N920 Excessive and frequent menstruation with regular cycle: Secondary | ICD-10-CM | POA: Diagnosis not present

## 2017-01-27 HISTORY — DX: Unspecified abnormal cytological findings in specimens from vagina: R87.629

## 2017-01-27 HISTORY — DX: Anxiety disorder, unspecified: F41.9

## 2017-01-27 LAB — URINALYSIS, ROUTINE W REFLEX MICROSCOPIC
BILIRUBIN URINE: NEGATIVE
Glucose, UA: NEGATIVE mg/dL
Hgb urine dipstick: NEGATIVE
KETONES UR: NEGATIVE mg/dL
Leukocytes, UA: NEGATIVE
NITRITE: NEGATIVE
PROTEIN: NEGATIVE mg/dL
Specific Gravity, Urine: 1.018 (ref 1.005–1.030)
pH: 5 (ref 5.0–8.0)

## 2017-01-27 LAB — POCT PREGNANCY, URINE: PREG TEST UR: NEGATIVE

## 2017-01-27 NOTE — Progress Notes (Signed)
Raelyn Moraolitta Dawson CNM in to see pt and discuss plan of care. Written and verbal d/c instructions given and understanding voiced

## 2017-01-27 NOTE — MAU Note (Addendum)
Pt had written down, unable to conceive on her check in paper.  Did not mention when assessing. Asked pt, she states they had written it on her referral sheet, because she has never been pregnant, though pt states she has never been actively trying to get pregnant.  Pt would like referral to one of our clinics.

## 2017-01-27 NOTE — MAU Provider Note (Signed)
History     CSN: [id number]  Arrival date and time: 01/27/17 1026   First Provider Initiated Contact with Patient 01/27/17 1141      Chief Complaint  Patient presents with  . Back Pain   HPI  Shelly York is a [age] yo non-pregnant female presenting requesting an ultrasound for ? Fibroids.  She was seen by her primary care provider at [facility] who referred her to [facility].  She went to [facility] today and was not able to be seen d/t co-pay issues.  She called her PCP and was told to go to [facility] for walk-in appt or [facility], if not able to be seen by [facility].  She reports some low back pain.  She recently had pap smear that was normal.  She has no other complaints.  Past Medical History:  Diagnosis Date  . Anxiety    hx of separation anxiety  . No pertinent past medical history   . Vaginal Pap smear, abnormal 2004   gonorrhea    Past Surgical History:  Procedure Laterality Date  . NO PAST SURGERIES      Family History  Problem Relation Age of Onset  . Hypertension Maternal Grandmother   . Diabetes Paternal Grandmother   . Stroke Paternal Grandmother   . Diabetes Maternal Grandfather   . Alcohol abuse Maternal Grandfather   . Anesthesia problems Neg Hx     Social History  Substance Use Topics  . Smoking status: Current Every Day Smoker    Packs/day: 0.25    Years: 15.00    Types: Cigarettes, Cigars  . Smokeless tobacco: Never Used  . Alcohol use 4.2 oz/week    7 Cans of beer per week     Comment: beer a day; liquor- socially    Allergies: No Known Allergies  Prescriptions Prior to Admission  Medication Sig Dispense Refill Last Dose  . Multiple Vitamins-Minerals (HAIR/SKIN/NAILS) CAPS Take 1 capsule by mouth daily.   01/26/2017 at Unknown time  . Multiple Vitamins-Minerals (MULTIVITAMIN WITH MINERALS) tablet Take 1 tablet by mouth daily.     01/26/2017 at Unknown time  . vitamin E 1000 UNIT capsule Take 1,000 Units by mouth daily.   01/26/2017 at Unknown time     Review of Systems  Constitutional: Negative.   HENT: Negative.   Eyes: Negative.   Respiratory: Negative.   Cardiovascular: Negative.   Gastrointestinal: Negative.   Endocrine: Negative.   Genitourinary: Negative.   Musculoskeletal: Positive for back pain.  Skin: Negative.   Allergic/Immunologic: Negative.   Neurological: Negative.   Hematological: Negative.   Psychiatric/Behavioral: Negative.    Physical Exam   Blood pressure 112/76, pulse 93, temperature 97.9 F (36.6 C), resp. rate 16, weight 117 lb 4 oz (53.2 kg), last menstrual period 12/30/2016, SpO2 100 %.  Physical Exam  Constitutional: She is oriented to person, place, and time. She appears well-developed and well-nourished.  Musculoskeletal: Normal range of motion.  Neurological: She is alert and oriented to person, place, and time. She has normal reflexes.  Skin: Skin is warm and dry.  Psychiatric: She has a normal mood and affect. Her behavior is normal. Judgment and thought content normal.    MAU Course  Procedures  MDM [facility]  Results for orders placed or performed during the hospital encounter of 01/27/17 (from the past 24 hour(s))  Urinalysis, Routine w reflex microscopic     Status: Abnormal   Collection Time: 01/27/17 10:37 AM  Result Value Ref Range   Color,  Urine YELLOW YELLOW   APPearance HAZY (A) CLEAR   Specific Gravity, Urine 1.018 1.005 - 1.030   pH 5.0 5.0 - 8.0   Glucose, UA NEGATIVE NEGATIVE mg/dL   Hgb urine dipstick NEGATIVE NEGATIVE   Bilirubin Urine NEGATIVE NEGATIVE   Ketones, ur NEGATIVE NEGATIVE mg/dL   Protein, ur NEGATIVE NEGATIVE mg/dL   Nitrite NEGATIVE NEGATIVE   Leukocytes, UA NEGATIVE NEGATIVE  Pregnancy, urine POC     Status: None   Collection Time: 01/27/17 10:48 AM  Result Value Ref Range   Preg Test, Ur NEGATIVE NEGATIVE   Assessment and Plan  Menorrhagia with regular cycle  - US Pelvis Complete ordered for 1 wk - F/U with GYN provider in [facility]  Discharge  home Patient verbalized an understanding of the plan of care and agrees.   Shelly Moraolitta Nesha Counihan, MSN, CNM 01/27/2017, 12:04 PM

## 2017-01-27 NOTE — MAU Note (Signed)
Sent over for US, to see if she has fibroids.  Hx of heavy bleeding with periods. (not currently on cycle).  Having a little pain in left kidney- comes and goes- had for about a month,usually feels it during ovulation time.

## 2017-02-01 ENCOUNTER — Other Ambulatory Visit: Payer: Self-pay | Admitting: Obstetrics and Gynecology

## 2017-02-01 DIAGNOSIS — N921 Excessive and frequent menstruation with irregular cycle: Secondary | ICD-10-CM

## 2017-02-02 ENCOUNTER — Ambulatory Visit (HOSPITAL_COMMUNITY): Payer: BLUE CROSS/BLUE SHIELD
